# Patient Record
Sex: Female | Born: 1950
Health system: Southern US, Community
[De-identification: ages and names within clinical notes are randomized; demographics above are authoritative.]

## PROBLEM LIST (undated history)

## (undated) DIAGNOSIS — S02609A Fracture of mandible, unspecified, initial encounter for closed fracture: Secondary | ICD-10-CM

## (undated) DIAGNOSIS — I10 Essential (primary) hypertension: Secondary | ICD-10-CM

## (undated) HISTORY — DX: Fracture of mandible, unspecified, initial encounter for closed fracture: S02.609A

## (undated) HISTORY — PX: BREAST SURGERY: SHX581

## (undated) HISTORY — PX: LEG SURGERY: SHX1003

## (undated) HISTORY — PX: SPLENECTOMY: SUR1306

## (undated) HISTORY — DX: Essential (primary) hypertension: I10

---

## 2018-06-20 LAB — FECAL OCCULT BLOOD, IMMUNOCHEMICAL: IFOBT: NEGATIVE

## 2018-07-25 NOTE — Progress Notes (Signed)
Subjective:    Patient ID: Amanda [name], female    DOB: 11/22/1950, 67 y.o.   MRN: [id number]  HPI:  Amanda [name] is here to establish as a new pt.  She is a pleasant 67 year old female. PMH:HTN, HLD, GAD, Tobacco Use Disorder, tension HA She estimates to drink >80 oz water/day and follows a heart healthy diet She practices yoga and uses essential oil to manage acute anxiety She denies hx of depression She estimates to smoke 10 cigarettes/day and is open to tobacco cessation today- great! She recently moved from [place], [place]  GAD- r/t family stress: 2089 year mother has advanced dementia, she recently fell and is [facility] in [place] [name] disagree with her recommendations on how to care for their mother She lives with her half-brother and her sister-in-law Half-brother is unable to work due to lupus and fibromyalgia She has been off her anti-hypertensive for 1 month Patient Care Team    Relationship Specialty Notifications Start End  , Jinny BlossomKaty D, NP PCP - General Family Medicine  07/26/18     Patient Active Problem List   Diagnosis Date Noted  . Healthcare maintenance 07/26/2018  . Hypertension 07/26/2018  . GAD (generalized anxiety disorder) 07/26/2018  . Tobacco use 07/26/2018     Past Medical History:  Diagnosis Date  . Hypertension   . Jaw fracture [facility](HCC)      Past Surgical History:  Procedure Laterality Date  . BREAST SURGERY     "crystals"  . CESAREAN SECTION    . LEG SURGERY    . SPLENECTOMY       Family History  Problem Relation Age of Onset  . Heart attack Mother   . Hyperlipidemia Mother   . Hypertension Mother   . Heart disease Mother      Social History   Substance and Sexual Activity  Drug Use Never     Social History   Substance and Sexual Activity  Alcohol Use Yes  . Alcohol/week: 1.0 standard drinks  . Types: 1 Glasses of wine per week     Social History   Tobacco Use  Smoking Status Current  Every Day Smoker  . Packs/day: 0.25  . Years: 10.00  . Pack years: 2.50  . Types: Cigarettes  Smokeless Tobacco Never Used     Outpatient Encounter Medications as of 07/26/2018  Medication Sig  . carvedilol (COREG) 3.125 MG tablet Take 1 tablet (3.125 mg total) by mouth 2 (two) times daily with a meal.  . [DISCONTINUED] carvedilol (COREG) 3.125 MG tablet Take 3.125 mg by mouth 2 (two) times daily with a meal.  . nicotine (NICODERM CQ - DOSED IN MG/24 HOURS) 14 mg/24hr patch Place 1 patch (14 mg total) onto the skin daily.   No facility-administered encounter medications on file as of 07/26/2018.     Allergies: Penicillins  Body mass index is 26.42 kg/m.  Blood pressure 128/85, pulse 71, temperature 97.8 F (36.6 C), temperature source Oral, height 5\' 4"  (1.626 m), weight 153 lb 14.4 oz (69.8 kg), SpO2 97 %.  Review of Systems  Constitutional: Positive for fatigue. Negative for activity change, appetite change, chills, diaphoresis, fever and unexpected weight change.  HENT: Negative for congestion.   Eyes: Negative for visual disturbance.  Respiratory: Negative for cough, chest tightness, shortness of breath, wheezing and stridor.   Cardiovascular: Negative for chest pain, palpitations and leg swelling.  Gastrointestinal: Negative for abdominal distention, abdominal pain, blood in stool, constipation, diarrhea, nausea  and vomiting.  Endocrine: Negative for cold intolerance, heat intolerance, polydipsia, polyphagia and polyuria.  Genitourinary: Negative for difficulty urinating.  Musculoskeletal: Negative for arthralgias, back pain, gait problem, joint swelling, myalgias, neck pain and neck stiffness.  Neurological: Positive for headaches. Negative for dizziness.  Hematological: Does not bruise/bleed easily.  Psychiatric/Behavioral: Positive for sleep disturbance. Negative for agitation, behavioral problems, confusion, decreased concentration, dysphoric mood, hallucinations,  self-injury and suicidal ideas. The patient is nervous/anxious. The patient is not hyperactive.        Objective:   Physical Exam Vitals signs and nursing note reviewed.  Constitutional:      General: She is not in acute distress.    Appearance: She is normal weight. She is not ill-appearing or toxic-appearing.  HENT:     Head: Normocephalic and atraumatic.     Nose: Nose normal.  Eyes:     Conjunctiva/sclera: Conjunctivae normal.     Pupils: Pupils are equal, round, and reactive to light.  Cardiovascular:     Rate and Rhythm: Normal rate and regular rhythm.     Pulses: Normal pulses.     Heart sounds: No murmur. No gallop.   Pulmonary:     Effort: Pulmonary effort is normal. No respiratory distress.     Breath sounds: Normal breath sounds. No stridor. No wheezing, rhonchi or rales.  Chest:     Chest wall: No tenderness.  Skin:    Capillary Refill: Capillary refill takes less than 2 seconds.  Neurological:     Mental Status: She is alert and oriented to person, place, and time.  Psychiatric:        Mood and Affect: Mood normal.        Behavior: Behavior normal.        Thought Content: Thought content normal.        Judgment: Judgment normal.           Assessment & Plan:   1. Healthcare maintenance   2. GAD (generalized anxiety disorder)   3. Essential hypertension   4. Tobacco use     Healthcare maintenance Please continue all medications as directed. Continue to drink plenty of water and follow heart healthy diet. Reduce to stop tobacco use- you can do it! Continue yoga practice. Referral to Behavioral Health, re: Generalized Anxiety Disorder. Please schedule complete physical in 3 months, fasting lab appt the week prior.  Hypertension BP 128/85, HR 71 Carvedilol 3.125mg  BID Denies current cardiac sx's  GAD (generalized anxiety disorder) Continue to practice yoga, use essential oils Referral to Behavioral Health placed  Tobacco use Nicoderm patch  sent in    FOLLOW-UP:  Return in about 3 months (around 10/25/2018) for CPE, Fasting Labs.

## 2018-07-26 ENCOUNTER — Encounter: Payer: Self-pay | Admitting: Adult Health

## 2018-07-26 ENCOUNTER — Ambulatory Visit (INDEPENDENT_AMBULATORY_CARE_PROVIDER_SITE_OTHER): Payer: Medicare Other | Admitting: Adult Health

## 2018-07-26 VITALS — BP 128/85 | HR 71 | Temp 97.8°F | Ht 64.0 in | Wt 153.9 lb

## 2018-07-26 DIAGNOSIS — F411 Generalized anxiety disorder: Secondary | ICD-10-CM | POA: Diagnosis not present

## 2018-07-26 DIAGNOSIS — Z0000 Encounter for general adult medical examination without abnormal findings: Secondary | ICD-10-CM | POA: Diagnosis not present

## 2018-07-26 DIAGNOSIS — I10 Essential (primary) hypertension: Secondary | ICD-10-CM | POA: Diagnosis not present

## 2018-07-26 DIAGNOSIS — Z720 Tobacco use: Secondary | ICD-10-CM | POA: Diagnosis not present

## 2018-07-26 MED ORDER — NICOTINE 14 MG/24HR TD PT24: 14.0000 mg | MEDICATED_PATCH | Freq: Every day | TRANSDERMAL | 0 refills | Status: DC

## 2018-07-26 MED ORDER — CARVEDILOL 3.125 MG PO TABS: 3.1250 mg | ORAL_TABLET | Freq: Two times a day (BID) | ORAL | 3 refills | Status: DC

## 2018-07-26 NOTE — Assessment & Plan Note (Addendum)
BP 128/85, HR 71 Carvedilol 3.125mg  BID Denies current cardiac sx's

## 2018-07-26 NOTE — Assessment & Plan Note (Signed)
Nicoderm patch sent in

## 2018-07-26 NOTE — Patient Instructions (Signed)
Mediterranean Diet A Mediterranean diet refers to food and lifestyle choices that are based on the traditions of countries located on the [employer]. This way of eating has been shown to help prevent certain conditions and improve outcomes for people who have chronic diseases, like kidney disease and heart disease. What are tips for following this plan? Lifestyle  Cook and eat meals together with your family, when possible.  Drink enough fluid to keep your urine clear or pale yellow.  Be physically active every day. This includes: ? Aerobic exercise like running or swimming. ? Leisure activities like gardening, walking, or housework.  Get 7-8 hours of sleep each night.  If recommended by your health care provider, drink red wine in moderation. This means 1 glass a day for nonpregnant women and 2 glasses a day for men. A glass of wine equals 5 oz (150 mL). Reading food labels   Check the serving size of packaged foods. For foods such as rice and pasta, the serving size refers to the amount of cooked product, not dry.  Check the total fat in packaged foods. Avoid foods that have saturated fat or trans fats.  Check the ingredients list for added sugars, such as corn syrup. Shopping  At the grocery store, buy most of your food from the areas near the walls of the store. This includes: ? Fresh fruits and vegetables (produce). ? Grains, beans, nuts, and seeds. Some of these may be available in unpackaged forms or large amounts (in bulk). ? Fresh seafood. ? Poultry and eggs. ? Low-fat dairy products.  Buy whole ingredients instead of prepackaged foods.  Buy fresh fruits and vegetables in-season from local farmers markets.  Buy frozen fruits and vegetables in resealable bags.  If you do not have access to quality fresh seafood, buy precooked frozen shrimp or canned fish, such as tuna, salmon, or sardines.  Buy small amounts of raw or cooked vegetables, salads, or olives from  the deli or salad bar at your store.  Stock your pantry so you always have certain foods on hand, such as olive oil, canned tuna, canned tomatoes, rice, pasta, and beans. Cooking  Cook foods with extra-virgin olive oil instead of using butter or other vegetable oils.  Have meat as a side dish, and have vegetables or grains as your main dish. This means having meat in small portions or adding small amounts of meat to foods like pasta or stew.  Use beans or vegetables instead of meat in common dishes like chili or lasagna.  Experiment with different cooking methods. Try roasting or broiling vegetables instead of steaming or sauteing them.  Add frozen vegetables to soups, stews, pasta, or rice.  Add nuts or seeds for added healthy fat at each meal. You can add these to yogurt, salads, or vegetable dishes.  Marinate fish or vegetables using olive oil, lemon juice, garlic, and fresh herbs. Meal planning   Plan to eat 1 vegetarian meal one day each week. Try to work up to 2 vegetarian meals, if possible.  Eat seafood 2 or more times a week.  Have healthy snacks readily available, such as: ? Vegetable sticks with hummus. ? [place] yogurt. ? Fruit and nut trail mix.  Eat balanced meals throughout the week. This includes: ? Fruit: 2-3 servings a day ? Vegetables: 4-5 servings a day ? Low-fat dairy: 2 servings a day ? Fish, poultry, or lean meat: 1 serving a day ? Beans and legumes: 2 or more servings a week ?  Nuts and seeds: 1-2 servings a day ? Whole grains: 6-8 servings a day ? Extra-virgin olive oil: 3-4 servings a day  Limit red meat and sweets to only a few servings a month What are my food choices?  Mediterranean diet ? Recommended ? Grains: Whole-grain pasta. Brown rice. Bulgar wheat. Polenta. Couscous. Whole-wheat bread. [place] Cobbatmeal. Quinoa. ? Vegetables: Artichokes. Beets. Broccoli. Cabbage. Carrots. Eggplant. Green beans. Chard. Kale. Spinach. Onions. Leeks. Peas. Squash.  Tomatoes. Peppers. Radishes. ? Fruits: Apples. Apricots. Avocado. Berries. Bananas. Cherries. Dates. Figs. Grapes. Lemons. Melon. Oranges. Peaches. Plums. Pomegranate. ? Meats and other protein foods: Beans. Almonds. Sunflower seeds. Pine nuts. Peanuts. Cod. Salmon. Scallops. Shrimp. Tuna. Tilapia. Clams. Oysters. Eggs. ? Dairy: Low-fat milk. Cheese. Greek yogurt. ? Beverages: Water. Red wine. Herbal tea. ? Fats and oils: Extra virgin olive oil. Avocado oil. Grape seed oil. ? Sweets and desserts: [place] yogurt with honey. Baked apples. Poached pears. Trail mix. ? Seasoning and other foods: Basil. Cilantro. Coriander. Cumin. Mint. Parsley. Sage. Rosemary. Tarragon. Garlic. Oregano. Thyme. Pepper. Balsalmic vinegar. Tahini. Hummus. Tomato sauce. Olives. Mushrooms. ? Limit these ? Grains: Prepackaged pasta or rice dishes. Prepackaged cereal with added sugar. ? Vegetables: Deep fried potatoes (french fries). ? Fruits: Fruit canned in syrup. ? Meats and other protein foods: Beef. Pork. Lamb. Poultry with skin. Hot dogs. Tomasa BlaseBacon. ? Dairy: Ice cream. Sour cream. Whole milk. ? Beverages: Juice. Sugar-sweetened soft drinks. Beer. Liquor and spirits. ? Fats and oils: Butter. Canola oil. Vegetable oil. Beef fat (tallow). Lard. ? Sweets and desserts: Cookies. Cakes. Pies. Candy. ? Seasoning and other foods: Mayonnaise. Premade sauces and marinades. ? The items listed may not be a complete list. Talk with your dietitian about what dietary choices are right for you. Summary  The Mediterranean diet includes both food and lifestyle choices.  Eat a variety of fresh fruits and vegetables, beans, nuts, seeds, and whole grains.  Limit the amount of red meat and sweets that you eat.  Talk with your health care provider about whether it is safe for you to drink red wine in moderation. This means 1 glass a day for nonpregnant women and 2 glasses a day for men. A glass of wine equals 5 oz (150 mL). This information  is not intended to replace advice given to you by your health care provider. Make sure you discuss any questions you have with your health care provider. Document Released: 03/17/2016 Document Revised: 04/19/2016 Document Reviewed: 03/17/2016 Elsevier Interactive Patient Education  2019 Elsevier Inc.  Generalized Anxiety Disorder, Adult Generalized anxiety disorder (GAD) is a mental health disorder. People with this condition constantly worry about everyday events. Unlike normal anxiety, worry related to GAD is not triggered by a specific event. These worries also do not fade or get better with time. GAD interferes with life functions, including relationships, work, and school. GAD can vary from mild to severe. People with severe GAD can have intense waves of anxiety with physical symptoms (panic attacks). What are the causes? The exact cause of GAD is not known. What increases the risk? This condition is more likely to develop in:  Women.  People who have a family history of anxiety disorders.  People who are very shy.  People who experience very stressful life events, such as the death of a loved one.  People who have a very stressful family environment. What are the signs or symptoms? People with GAD often worry excessively about many things in their lives, such as their health and  family. They may also be overly concerned about:  Doing well at work.  Being on time.  Natural disasters.  Friendships. Physical symptoms of GAD include:  Fatigue.  Muscle tension or having muscle twitches.  Trembling or feeling shaky.  Being easily startled.  Feeling like your heart is pounding or racing.  Feeling out of breath or like you cannot take a deep breath.  Having trouble falling asleep or staying asleep.  Sweating.  Nausea, diarrhea, or irritable bowel syndrome (IBS).  Headaches.  Trouble concentrating or remembering facts.  Restlessness.  Irritability. How is this  diagnosed? Your health care provider can diagnose GAD based on your symptoms and medical history. You will also have a physical exam. The health care provider will ask specific questions about your symptoms, including how severe they are, when they started, and if they come and go. Your health care provider may ask you about your use of alcohol or drugs, including prescription medicines. Your health care provider may refer you to a mental health specialist for further evaluation. Your health care provider will do a thorough examination and may perform additional tests to rule out other possible causes of your symptoms. To be diagnosed with GAD, a person must have anxiety that:  Is out of his or her control.  Affects several different aspects of his or her life, such as work and relationships.  Causes distress that makes him or her unable to take part in normal activities.  Includes at least three physical symptoms of GAD, such as restlessness, fatigue, trouble concentrating, irritability, muscle tension, or sleep problems. Before your health care provider can confirm a diagnosis of GAD, these symptoms must be present more days than they are not, and they must last for six months or longer. How is this treated? The following therapies are usually used to treat GAD:  Medicine. Antidepressant medicine is usually prescribed for long-term daily control. Antianxiety medicines may be added in severe cases, especially when panic attacks occur.  Talk therapy (psychotherapy). Certain types of talk therapy can be helpful in treating GAD by providing support, education, and guidance. Options include: ? Cognitive behavioral therapy (CBT). People learn coping skills and techniques to ease their anxiety. They learn to identify unrealistic or negative thoughts and behaviors and to replace them with positive ones. ? Acceptance and commitment therapy (ACT). This treatment teaches people how to be mindful as a way  to cope with unwanted thoughts and feelings. ? Biofeedback. This process trains you to manage your body's response (physiological response) through breathing techniques and relaxation methods. You will work with a therapist while machines are used to monitor your physical symptoms.  Stress management techniques. These include yoga, meditation, and exercise. A mental health specialist can help determine which treatment is best for you. Some people see improvement with one type of therapy. However, other people require a combination of therapies. Follow these instructions at home:  Take over-the-counter and prescription medicines only as told by your health care provider.  Try to maintain a normal routine.  Try to anticipate stressful situations and allow extra time to manage them.  Practice any stress management or self-calming techniques as taught by your health care provider.  Do not punish yourself for setbacks or for not making progress.  Try to recognize your accomplishments, even if they are small.  Keep all follow-up visits as told by your health care provider. This is important. Contact a health care provider if:  Your symptoms do not get better.  Your symptoms get worse.  You have signs of depression, such as: ? A persistently sad, cranky, or irritable mood. ? Loss of enjoyment in activities that used to bring you joy. ? Change in weight or eating. ? Changes in sleeping habits. ? Avoiding friends or family members. ? Loss of energy for normal tasks. ? Feelings of guilt or worthlessness. Get help right away if:  You have serious thoughts about hurting yourself or others. If you ever feel like you may hurt yourself or others, or have thoughts about taking your own life, get help right away. You can go to your nearest emergency department or call:  Your local emergency services (911 in the [place]  A suicide crisis helpline, such as the National Suicide Prevention  Lifeline at 684-657-89961-(520)695-0972. This is open 24 hours a day. Summary  Generalized anxiety disorder (GAD) is a mental health disorder that involves worry that is not triggered by a specific event.  People with GAD often worry excessively about many things in their lives, such as their health and family.  GAD may cause physical symptoms such as restlessness, trouble concentrating, sleep problems, frequent sweating, nausea, diarrhea, headaches, and trembling or muscle twitching.  A mental health specialist can help determine which treatment is best for you. Some people see improvement with one type of therapy. However, other people require a combination of therapies. This information is not intended to replace advice given to you by your health care provider. Make sure you discuss any questions you have with your health care provider. Document Released: 11/19/2012 Document Revised: 06/14/2016 Document Reviewed: 06/14/2016 Elsevier Interactive Patient Education  [employer]2019 Elsevier Inc.  Please continue all medications as directed. Continue to drink plenty of water and follow heart healthy diet. Reduce to stop tobacco use- you can do it! Continue yoga practice. Referral to Behavioral Health, re: Generalized Anxiety Disorder. Please schedule complete physical in 3 months, fasting lab appt the week prior. GOOD LUCK WITH THE PUPPY LITTER! WELCOME TO THE PRACTICE!

## 2018-07-26 NOTE — Assessment & Plan Note (Signed)
Continue to practice yoga, use essential oils Referral to Behavioral Health placed

## 2018-07-26 NOTE — Assessment & Plan Note (Signed)
Please continue all medications as directed. Continue to drink plenty of water and follow heart healthy diet. Reduce to stop tobacco use- you can do it! Continue yoga practice. Referral to Behavioral Health, re: Generalized Anxiety Disorder. Please schedule complete physical in 3 months, fasting lab appt the week prior.

## 2018-09-17 ENCOUNTER — Ambulatory Visit (INDEPENDENT_AMBULATORY_CARE_PROVIDER_SITE_OTHER): Payer: Medicare Other | Admitting: Psychology

## 2018-09-17 DIAGNOSIS — F411 Generalized anxiety disorder: Secondary | ICD-10-CM | POA: Diagnosis not present

## 2018-10-23 ENCOUNTER — Ambulatory Visit (INDEPENDENT_AMBULATORY_CARE_PROVIDER_SITE_OTHER): Payer: Medicare Other | Admitting: Psychology

## 2018-10-23 DIAGNOSIS — F411 Generalized anxiety disorder: Secondary | ICD-10-CM

## 2018-10-25 ENCOUNTER — Other Ambulatory Visit (INDEPENDENT_AMBULATORY_CARE_PROVIDER_SITE_OTHER): Payer: Medicare Other

## 2018-10-25 ENCOUNTER — Other Ambulatory Visit: Payer: Self-pay

## 2018-10-25 DIAGNOSIS — Z0000 Encounter for general adult medical examination without abnormal findings: Secondary | ICD-10-CM | POA: Diagnosis not present

## 2018-10-25 DIAGNOSIS — R7303 Prediabetes: Secondary | ICD-10-CM | POA: Diagnosis not present

## 2018-10-25 DIAGNOSIS — E785 Hyperlipidemia, unspecified: Secondary | ICD-10-CM | POA: Diagnosis not present

## 2018-10-25 DIAGNOSIS — Z79899 Other long term (current) drug therapy: Secondary | ICD-10-CM | POA: Diagnosis not present

## 2018-10-25 DIAGNOSIS — I10 Essential (primary) hypertension: Secondary | ICD-10-CM | POA: Diagnosis not present

## 2018-10-25 DIAGNOSIS — R5383 Other fatigue: Secondary | ICD-10-CM | POA: Diagnosis not present

## 2018-10-26 LAB — CBC WITH DIFFERENTIAL/PLATELET
Basophils Absolute: 0.1 10*3/uL (ref 0.0–0.2)
Basos: 1 %
EOS (ABSOLUTE): 0.2 10*3/uL (ref 0.0–0.4)
Eos: 3 %
Hematocrit: 40.1 % (ref 34.0–46.6)
Hemoglobin: 13.6 g/dL (ref 11.1–15.9)
Immature Grans (Abs): 0 10*3/uL (ref 0.0–0.1)
Immature Granulocytes: 0 %
Lymphocytes Absolute: 2.4 10*3/uL (ref 0.7–3.1)
Lymphs: 31 %
MCH: 32.3 pg (ref 26.6–33.0)
MCHC: 33.9 g/dL (ref 31.5–35.7)
MCV: 95 fL (ref 79–97)
Monocytes Absolute: 0.9 10*3/uL (ref 0.1–0.9)
Monocytes: 12 %
Neutrophils Absolute: 4.1 10*3/uL (ref 1.4–7.0)
Neutrophils: 53 %
Platelets: 446 10*3/uL (ref 150–450)
RBC: 4.21 x10E6/uL (ref 3.77–5.28)
RDW: 11.8 % (ref 11.7–15.4)
WBC: 7.7 10*3/uL (ref 3.4–10.8)

## 2018-10-26 LAB — COMPREHENSIVE METABOLIC PANEL
ALT: 18 IU/L (ref 0–32)
AST: 16 IU/L (ref 0–40)
Albumin/Globulin Ratio: 1.8 (ref 1.2–2.2)
Albumin: 4 g/dL (ref 3.8–4.8)
Alkaline Phosphatase: 79 IU/L (ref 39–117)
BUN/Creatinine Ratio: 18 (ref 12–28)
BUN: 12 mg/dL (ref 8–27)
Bilirubin Total: 0.3 mg/dL (ref 0.0–1.2)
CO2: 23 mmol/L (ref 20–29)
Calcium: 9.7 mg/dL (ref 8.7–10.3)
Chloride: 105 mmol/L (ref 96–106)
Creatinine, Ser: 0.65 mg/dL (ref 0.57–1.00)
GFR calc Af Amer: 105 mL/min/{1.73_m2} (ref >59)
GFR calc non Af Amer: 91 mL/min/{1.73_m2} (ref >59)
Globulin, Total: 2.2 g/dL (ref 1.5–4.5)
Glucose: 85 mg/dL (ref 65–99)
Potassium: 4.3 mmol/L (ref 3.5–5.2)
Sodium: 142 mmol/L (ref 134–144)
Total Protein: 6.2 g/dL (ref 6.0–8.5)

## 2018-10-26 LAB — LIPID PANEL
Chol/HDL Ratio: 3.9 ratio (ref 0.0–4.4)
Cholesterol, Total: 219 mg/dL — ABNORMAL HIGH (ref 100–199)
HDL: 56 mg/dL (ref >39)
LDL Calculated: 138 mg/dL — ABNORMAL HIGH (ref 0–99)
Triglycerides: 126 mg/dL (ref 0–149)
VLDL Cholesterol Cal: 25 mg/dL (ref 5–40)

## 2018-10-26 LAB — HEMOGLOBIN A1C
Est. average glucose Bld gHb Est-mCnc: 117 mg/dL
Hgb A1c MFr Bld: 5.7 % — ABNORMAL HIGH (ref 4.8–5.6)

## 2018-10-26 LAB — TSH: TSH: 1.81 u[IU]/mL (ref 0.450–4.500)

## 2018-10-29 NOTE — Progress Notes (Deleted)
Subjective:    Patient ID: Amanda [name], female    DOB: 01-05-1951, 68 y.o.   MRN: [id number]  HPI: 07/26/18 OV:  Amanda [name] is here to establish as a new pt.  She is a pleasant 68 year old female. PMH:HTN, HLD, GAD, Tobacco Use Disorder, tension HA She estimates to drink >80 oz water/day and follows a heart healthy diet She practices yoga and uses essential oil to manage acute anxiety She denies hx of depression She estimates to smoke 10 cigarettes/day and is open to tobacco cessation today- great! She recently moved from [place], [place]  GAD- r/t family stress: 73 year mother has advanced dementia, she recently fell and is [facility] in [place] [name] disagree with her recommendations on how to care for their mother She lives with her half-brother and her sister-in-law Half-brother is unable to work due to lupus and fibromyalgia She has been off her anti-hypertensive for 1 month  11/01/2018 OV: Amanda [name] is here for CPE  Reviewed Recent Labs: TSH-WNL, 1.810 The 10-year ASCVD risk score Denman George DC Montez Hageman., et al., 2013) is: 18.9% Values used to calculate the score:  Age: 32 years  Sex: Female  Is Non-Hispanic African American: No  Diabetic: No  Tobacco smoker: Yes  Systolic Blood Pressure: 128 mmHg  Is BP treated: Yes  HDL Cholesterol: 56 mg/dL  Total Cholesterol: 067 mg/dL [id number] Rec starting statin therapy A1c-5.7, pre-diabetic  CMP-WNL CBC-WNL  Healthcare Maintenance: PAP- Mammogram- Immunizations-  Patient Care Team    Relationship Specialty Notifications Start End  Julaine Fusi, NP PCP - General Family Medicine  07/26/18     Patient Active Problem List   Diagnosis Date Noted  . Healthcare maintenance 07/26/2018  . Hypertension 07/26/2018  . GAD (generalized anxiety disorder) 07/26/2018  . Tobacco use 07/26/2018     Past Medical History:  Diagnosis Date  . Hypertension   . Jaw fracture [facility])       Past Surgical History:  Procedure Laterality Date  . BREAST SURGERY     "crystals"  . CESAREAN SECTION    . LEG SURGERY    . SPLENECTOMY       Family History  Problem Relation Age of Onset  . Heart attack Mother   . Hyperlipidemia Mother   . Hypertension Mother   . Heart disease Mother      Social History   Substance and Sexual Activity  Drug Use Never     Social History   Substance and Sexual Activity  Alcohol Use Yes  . Alcohol/week: 1.0 standard drinks  . Types: 1 Glasses of wine per week     Social History   Tobacco Use  Smoking Status Current Every Day Smoker  . Packs/day: 0.25  . Years: 10.00  . Pack years: 2.50  . Types: Cigarettes  Smokeless Tobacco Never Used     Outpatient Encounter Medications as of 11/01/2018  Medication Sig  . carvedilol (COREG) 3.125 MG tablet Take 1 tablet (3.125 mg total) by mouth 2 (two) times daily with a meal.  . nicotine (NICODERM CQ - DOSED IN MG/24 HOURS) 14 mg/24hr patch Place 1 patch (14 mg total) onto the skin daily.   No facility-administered encounter medications on file as of 11/01/2018.     Allergies: Penicillins  There is no height or weight on file to calculate BMI.  There were no vitals taken for this visit.  Review of Systems  Constitutional: Positive for fatigue. Negative for activity change,  appetite change, chills, diaphoresis, fever and unexpected weight change.  HENT: Negative for congestion.   Eyes: Negative for visual disturbance.  Respiratory: Negative for cough, chest tightness, shortness of breath, wheezing and stridor.   Cardiovascular: Negative for chest pain, palpitations and leg swelling.  Gastrointestinal: Negative for abdominal distention, abdominal pain, blood in stool, constipation, diarrhea, nausea and vomiting.  Endocrine: Negative for cold intolerance, heat intolerance, polydipsia, polyphagia and polyuria.  Genitourinary: Negative for difficulty urinating.   Musculoskeletal: Negative for arthralgias, back pain, gait problem, joint swelling, myalgias, neck pain and neck stiffness.  Neurological: Positive for headaches. Negative for dizziness.  Hematological: Does not bruise/bleed easily.  Psychiatric/Behavioral: Positive for sleep disturbance. Negative for agitation, behavioral problems, confusion, decreased concentration, dysphoric mood, hallucinations, self-injury and suicidal ideas. The patient is nervous/anxious. The patient is not hyperactive.        Objective:   Physical Exam Vitals signs and nursing note reviewed.  Constitutional:      General: She is not in acute distress.    Appearance: She is normal weight. She is not ill-appearing or toxic-appearing.  HENT:     Head: Normocephalic and atraumatic.     Nose: Nose normal.  Eyes:     Conjunctiva/sclera: Conjunctivae normal.     Pupils: Pupils are equal, round, and reactive to light.  Cardiovascular:     Rate and Rhythm: Normal rate and regular rhythm.     Pulses: Normal pulses.     Heart sounds: No murmur. No gallop.   Pulmonary:     Effort: Pulmonary effort is normal. No respiratory distress.     Breath sounds: Normal breath sounds. No stridor. No wheezing, rhonchi or rales.  Chest:     Chest wall: No tenderness.  Skin:    Capillary Refill: Capillary refill takes less than 2 seconds.  Neurological:     Mental Status: She is alert and oriented to person, place, and time.  Psychiatric:        Mood and Affect: Mood normal.        Behavior: Behavior normal.        Thought Content: Thought content normal.        Judgment: Judgment normal.           Assessment & Plan:   No diagnosis found.  No problem-specific Assessment & Plan notes found for this encounter.    FOLLOW-UP:  No follow-ups on file.

## 2018-10-31 ENCOUNTER — Encounter: Payer: Self-pay | Admitting: Adult Health

## 2018-11-01 ENCOUNTER — Encounter: Payer: Self-pay | Admitting: Adult Health

## 2018-11-01 ENCOUNTER — Other Ambulatory Visit: Payer: Self-pay | Admitting: Adult Health

## 2018-11-01 DIAGNOSIS — E7800 Pure hypercholesterolemia, unspecified: Secondary | ICD-10-CM

## 2018-11-01 DIAGNOSIS — Z79899 Other long term (current) drug therapy: Secondary | ICD-10-CM

## 2018-11-01 MED ORDER — ATORVASTATIN CALCIUM 10 MG PO TABS: 10.0000 mg | ORAL_TABLET | Freq: Every day | ORAL | 1 refills | Status: DC

## 2018-11-06 ENCOUNTER — Ambulatory Visit (INDEPENDENT_AMBULATORY_CARE_PROVIDER_SITE_OTHER): Payer: Medicare Other | Admitting: Psychology

## 2018-11-06 DIAGNOSIS — F411 Generalized anxiety disorder: Secondary | ICD-10-CM | POA: Diagnosis not present

## 2018-11-21 ENCOUNTER — Ambulatory Visit (INDEPENDENT_AMBULATORY_CARE_PROVIDER_SITE_OTHER): Payer: Medicare Other | Admitting: Psychology

## 2018-11-21 DIAGNOSIS — F411 Generalized anxiety disorder: Secondary | ICD-10-CM

## 2018-12-03 ENCOUNTER — Ambulatory Visit (INDEPENDENT_AMBULATORY_CARE_PROVIDER_SITE_OTHER): Payer: Medicare Other | Admitting: Psychology

## 2018-12-03 DIAGNOSIS — F411 Generalized anxiety disorder: Secondary | ICD-10-CM | POA: Diagnosis not present

## 2018-12-17 ENCOUNTER — Ambulatory Visit (INDEPENDENT_AMBULATORY_CARE_PROVIDER_SITE_OTHER): Payer: Medicare Other | Admitting: Psychology

## 2018-12-17 DIAGNOSIS — F411 Generalized anxiety disorder: Secondary | ICD-10-CM | POA: Diagnosis not present

## 2019-01-02 ENCOUNTER — Ambulatory Visit (INDEPENDENT_AMBULATORY_CARE_PROVIDER_SITE_OTHER): Payer: Medicare Other | Admitting: Psychology

## 2019-01-02 DIAGNOSIS — F411 Generalized anxiety disorder: Secondary | ICD-10-CM

## 2019-01-15 ENCOUNTER — Ambulatory Visit (INDEPENDENT_AMBULATORY_CARE_PROVIDER_SITE_OTHER): Payer: Medicare Other | Admitting: Psychology

## 2019-01-15 DIAGNOSIS — F411 Generalized anxiety disorder: Secondary | ICD-10-CM | POA: Diagnosis not present

## 2019-01-28 ENCOUNTER — Ambulatory Visit: Payer: Medicare Other | Admitting: Psychology

## 2019-01-31 ENCOUNTER — Ambulatory Visit (INDEPENDENT_AMBULATORY_CARE_PROVIDER_SITE_OTHER): Payer: Medicare Other | Admitting: Psychology

## 2019-01-31 DIAGNOSIS — F411 Generalized anxiety disorder: Secondary | ICD-10-CM

## 2019-02-11 ENCOUNTER — Ambulatory Visit (INDEPENDENT_AMBULATORY_CARE_PROVIDER_SITE_OTHER): Payer: Medicare Other | Admitting: Psychology

## 2019-02-11 DIAGNOSIS — F411 Generalized anxiety disorder: Secondary | ICD-10-CM

## 2019-02-25 ENCOUNTER — Ambulatory Visit (INDEPENDENT_AMBULATORY_CARE_PROVIDER_SITE_OTHER): Payer: Medicare Other | Admitting: Psychology

## 2019-02-25 DIAGNOSIS — F411 Generalized anxiety disorder: Secondary | ICD-10-CM

## 2019-03-25 ENCOUNTER — Ambulatory Visit (INDEPENDENT_AMBULATORY_CARE_PROVIDER_SITE_OTHER): Payer: Medicare Other | Admitting: Psychology

## 2019-03-25 DIAGNOSIS — F331 Major depressive disorder, recurrent, moderate: Secondary | ICD-10-CM

## 2019-04-08 ENCOUNTER — Ambulatory Visit (INDEPENDENT_AMBULATORY_CARE_PROVIDER_SITE_OTHER): Payer: Medicare Other | Admitting: Psychology

## 2019-04-08 DIAGNOSIS — F411 Generalized anxiety disorder: Secondary | ICD-10-CM

## 2019-04-22 ENCOUNTER — Ambulatory Visit (INDEPENDENT_AMBULATORY_CARE_PROVIDER_SITE_OTHER): Payer: Medicare Other | Admitting: Psychology

## 2019-04-22 DIAGNOSIS — F411 Generalized anxiety disorder: Secondary | ICD-10-CM

## 2019-05-06 ENCOUNTER — Ambulatory Visit (INDEPENDENT_AMBULATORY_CARE_PROVIDER_SITE_OTHER): Payer: Medicare Other | Admitting: Psychology

## 2019-05-06 DIAGNOSIS — F411 Generalized anxiety disorder: Secondary | ICD-10-CM

## 2019-05-20 ENCOUNTER — Ambulatory Visit (INDEPENDENT_AMBULATORY_CARE_PROVIDER_SITE_OTHER): Payer: Medicare Other | Admitting: Psychology

## 2019-05-20 DIAGNOSIS — F411 Generalized anxiety disorder: Secondary | ICD-10-CM

## 2019-06-03 ENCOUNTER — Ambulatory Visit (INDEPENDENT_AMBULATORY_CARE_PROVIDER_SITE_OTHER): Payer: Medicare Other | Admitting: Psychology

## 2019-06-03 DIAGNOSIS — F411 Generalized anxiety disorder: Secondary | ICD-10-CM | POA: Diagnosis not present

## 2019-06-17 ENCOUNTER — Ambulatory Visit (INDEPENDENT_AMBULATORY_CARE_PROVIDER_SITE_OTHER): Payer: Medicare Other | Admitting: Psychology

## 2019-06-17 DIAGNOSIS — F411 Generalized anxiety disorder: Secondary | ICD-10-CM

## 2019-07-01 ENCOUNTER — Ambulatory Visit: Payer: Medicare Other | Admitting: Psychology

## 2019-07-15 ENCOUNTER — Ambulatory Visit (INDEPENDENT_AMBULATORY_CARE_PROVIDER_SITE_OTHER): Payer: Medicare Other | Admitting: Psychology

## 2019-07-15 DIAGNOSIS — F321 Major depressive disorder, single episode, moderate: Secondary | ICD-10-CM | POA: Diagnosis not present

## 2019-07-29 ENCOUNTER — Ambulatory Visit: Payer: Medicare Other | Admitting: Psychology

## 2019-08-12 ENCOUNTER — Ambulatory Visit (INDEPENDENT_AMBULATORY_CARE_PROVIDER_SITE_OTHER): Payer: Medicare Other | Admitting: Psychology

## 2019-08-12 DIAGNOSIS — F411 Generalized anxiety disorder: Secondary | ICD-10-CM

## 2019-08-26 ENCOUNTER — Ambulatory Visit (INDEPENDENT_AMBULATORY_CARE_PROVIDER_SITE_OTHER): Payer: Medicare Other | Admitting: Psychology

## 2019-08-26 DIAGNOSIS — F411 Generalized anxiety disorder: Secondary | ICD-10-CM | POA: Diagnosis not present

## 2019-08-29 ENCOUNTER — Other Ambulatory Visit: Payer: Self-pay | Admitting: Adult Health

## 2019-08-30 ENCOUNTER — Telehealth: Payer: Self-pay

## 2019-08-30 NOTE — Telephone Encounter (Signed)
Please call pt to schedule f/u in office.  No further refills until pt is seen.  Tiajuana Amass, CMA

## 2019-09-09 ENCOUNTER — Ambulatory Visit (INDEPENDENT_AMBULATORY_CARE_PROVIDER_SITE_OTHER): Payer: Medicare Other | Admitting: Psychology

## 2019-09-09 DIAGNOSIS — F411 Generalized anxiety disorder: Secondary | ICD-10-CM

## 2019-09-12 ENCOUNTER — Telehealth: Payer: Self-pay | Admitting: Adult Health

## 2019-09-12 NOTE — Telephone Encounter (Signed)
Pt called states she had a home visit with [employer] (her ins co) nurse they sent to house for check up due to COVID isolation.  --Per patient she will bring all paper work/ lab results to appt on 2/10 for provider review-- she was advised by them to seek appt w/ PCP to address health concerns  --P requested IN OFFICE Appt hasn't seen provider in a year.  --Forwarding request to med asst .  --glh

## 2019-09-17 NOTE — Progress Notes (Signed)
Subjective:    Patient ID: Amanda [name], female    DOB: November 01, 1950, 69 y.o.   MRN: [id number]  HPI:  07/26/2018 OV: Amanda [name] is here to establish as a new pt.  She is a pleasant 69 year old female. PMH:HTN, HLD, GAD, Tobacco Use Disorder, tension HA She estimates to drink >80 oz water/day and follows a heart healthy diet She practices yoga and uses essential oil to manage acute anxiety She denies hx of depression She estimates to smoke 10 cigarettes/day and is open to tobacco cessation today- great! She recently moved from [place]  GAD- r/t family stress: 48 year mother has advanced dementia, she recently fell and is [place] in [place] [name] disagree with her recommendations on how to care for their mother She lives with her half-brother and her sister-in-law Half-brother is unable to work due to lupus and fibromyalgia She has been off her anti-hypertensive for 1 month  09/18/2019 OV: Amanda [name] is here to discuss "a few issues"- She would like Cologuard ordered- declines Colonoscopy due to her sister experiencing complications with procedure. She needs mammogram ordered. She requests referral to Optometry- placed. She stopped Atorvastatin 71m Dec 2020 due to GI upset. She had cancelled previously scheduled CPE with fasting labs due to pandemic exposure concerns. She continues to follow heart healthy diet, remains quite active. She denies acute complaints today.  Patient Care Team    Relationship Specialty Notifications Start End  DEsaw Grandchild NP PCP - General Family Medicine  07/26/18     Patient Active Problem List   Diagnosis Date Noted  . Healthcare maintenance 07/26/2018  . Hypertension 07/26/2018  . GAD (generalized anxiety disorder) 07/26/2018  . Tobacco use 07/26/2018     Past Medical History:  Diagnosis Date  . Hypertension   . Jaw fracture ([facility]      Past Surgical History:  Procedure Laterality Date  . BREAST  SURGERY     "crystals"  . CESAREAN SECTION    . LEG SURGERY    . SPLENECTOMY       Family History  Problem Relation Age of Onset  . Heart attack Mother   . Hyperlipidemia Mother   . Hypertension Mother   . Heart disease Mother      Social History   Substance and Sexual Activity  Drug Use Never     Social History   Substance and Sexual Activity  Alcohol Use Yes  . Alcohol/week: 1.0 standard drinks  . Types: 1 Glasses of wine per week     Social History   Tobacco Use  Smoking Status Current Every Day Smoker  . Packs/day: 0.25  . Years: 10.00  . Pack years: 2.50  . Types: Cigarettes  Smokeless Tobacco Never Used     Outpatient Encounter Medications as of 09/18/2019  Medication Sig  . carvedilol (COREG) 3.125 MG tablet TAKE 1 TABLET BY MOUTH TWICE DAILY WITH A MEAL  . nicotine (NICODERM CQ - DOSED IN MG/24 HOURS) 14 mg/24hr patch Place 1 patch (14 mg total) onto the skin daily.  . [DISCONTINUED] atorvastatin (LIPITOR) 10 MG tablet Take 1 tablet (10 mg total) by mouth daily.   No facility-administered encounter medications on file as of 09/18/2019.    Allergies: Atorvastatin and Penicillins  Body mass index is 27.4 kg/m.  Blood pressure 131/64, pulse 66, temperature 97.9 F (36.6 C), temperature source Oral, height '5\' 4"'  (1.626 m), weight 159 lb 9.6 oz (72.4 kg), SpO2 99 %.  Review of Systems  Constitutional: Positive for fatigue. Negative for activity change, appetite change, chills, diaphoresis, fever and unexpected weight change.  HENT: Negative for congestion.   Respiratory: Negative for cough, chest tightness, shortness of breath, wheezing and stridor.   Cardiovascular: Negative for chest pain, palpitations and leg swelling.  Gastrointestinal: Negative for abdominal distention, abdominal pain, blood in stool, constipation, diarrhea, nausea and vomiting.  Endocrine: Negative for polydipsia, polyphagia and polyuria.  Genitourinary: Negative for  difficulty urinating and flank pain.  Musculoskeletal: Negative for arthralgias, back pain, gait problem, joint swelling, myalgias, neck pain and neck stiffness.  Neurological: Negative for dizziness and headaches.  Hematological: Negative for adenopathy. Does not bruise/bleed easily.  Psychiatric/Behavioral: Negative for agitation, behavioral problems, confusion, decreased concentration, dysphoric mood, hallucinations, self-injury, sleep disturbance and suicidal ideas. The patient is not nervous/anxious and is not hyperactive.        Objective:   Physical Exam Vitals and nursing note reviewed.  Constitutional:      General: She is not in acute distress.    Appearance: Normal appearance. She is normal weight. She is not ill-appearing, toxic-appearing or diaphoretic.  HENT:     Head: Normocephalic and atraumatic.  Eyes:     Extraocular Movements: Extraocular movements intact.     Conjunctiva/sclera: Conjunctivae normal.     Pupils: Pupils are equal, round, and reactive to light.  Cardiovascular:     Rate and Rhythm: Normal rate and regular rhythm.     Pulses: Normal pulses.     Heart sounds: Normal heart sounds. No murmur. No friction rub. No gallop.   Pulmonary:     Effort: Pulmonary effort is normal. No respiratory distress.     Breath sounds: Normal breath sounds. No stridor. No wheezing, rhonchi or rales.  Chest:     Chest wall: No tenderness.  Skin:    General: Skin is warm and dry.     Capillary Refill: Capillary refill takes less than 2 seconds.  Neurological:     Mental Status: She is alert and oriented to person, place, and time.  Psychiatric:        Mood and Affect: Mood normal.        Behavior: Behavior normal.        Thought Content: Thought content normal.        Judgment: Judgment normal.        Assessment & Plan:   1. Encounter for screening mammogram for malignant neoplasm of breast   2. Screening for colon cancer   3. Encounter for vision screening   4.  Healthcare maintenance     Healthcare maintenance Cologuard ordered. Mammogram ordered. Optometry referral placed. Continue all medications as directed. Remain well hydrated, follow Continue regular exercise.  Recommend at least 30 minutes daily, 5 days per week of walking,, biking, swimming, YouTube/Pinterest workout videos. Basic information for mRNA COVID-19 vaccines Please note that these CDC guidelines can change from week to week, and even day to day.  Refer to the [facility] website for the most current recommendations for any given day at GeekRegister.com.ee   1) Under the EUAs (emergency use authorization), the following age groups are authorized to receive the COVID-19 vaccination: . Pfizer-BioNTech: ages ?16 years . Moderna: ages ?18 years . Children and adolescents outside of these authorized age groups should not receive COVID-19 vaccination at this time   2) You are not eligible to receive a COVID-19 vaccine if: . You have received another vaccine in the last 14-days (example: flu shot,  shingles, tetanus, etc.) . You currently have a positive test for COVID-19. The vaccine must be deferred until you have recovered from the acute illness and the criteria has been met for you to discontinue isolation. . You have received passive antibody therapy (monoclonal antibodies or convalescent serum) as treatment for COVID-19 within the past 90 days   3) Currently COVID-19 vaccines are contraindicated in patients who have had an anaphylactic reaction to polyethylene glycol ( ie. GoLYTELY bowel prep or MiraLAX ) or polysorbate products (often used as a thickening agent for many ice creams, frozen custard and whipped dessert products etc).   Also, if you had an anaphylactic reaction to your first COVID-19 vaccination, you cannot receive your second.     - Also if you are currently pregnant please check with your obstetrician to see what their recommendations are regarding  vaccination.   - Also, if you have a chronic condition such as CLL, RA or other immunocompromising conditions, please know that no data are currently available (or very limited data) on the safety and efficacy of mRNA COVID-19 vaccines in persons with autoimmune/ immunocompromising conditions.   Please seek the advice of your specialist regarding whether or not you should receive your COVID-19 vaccination.   Please call [phone] to schedule COVID-19 vaccine.  Follow-up in  3 months for physical with fasting labs.    FOLLOW-UP:  Return for CPE, Fasting Labs.

## 2019-09-18 ENCOUNTER — Encounter: Payer: Self-pay | Admitting: Adult Health

## 2019-09-18 ENCOUNTER — Ambulatory Visit (INDEPENDENT_AMBULATORY_CARE_PROVIDER_SITE_OTHER): Payer: Medicare Other | Admitting: Adult Health

## 2019-09-18 ENCOUNTER — Other Ambulatory Visit: Payer: Self-pay

## 2019-09-18 VITALS — BP 131/64 | HR 66 | Temp 97.9°F | Ht 64.0 in | Wt 159.6 lb

## 2019-09-18 DIAGNOSIS — Z0100 Encounter for examination of eyes and vision without abnormal findings: Secondary | ICD-10-CM | POA: Diagnosis not present

## 2019-09-18 DIAGNOSIS — Z1231 Encounter for screening mammogram for malignant neoplasm of breast: Secondary | ICD-10-CM | POA: Diagnosis not present

## 2019-09-18 DIAGNOSIS — Z1211 Encounter for screening for malignant neoplasm of colon: Secondary | ICD-10-CM | POA: Diagnosis not present

## 2019-09-18 DIAGNOSIS — Z0000 Encounter for general adult medical examination without abnormal findings: Secondary | ICD-10-CM

## 2019-09-18 MED ORDER — CARVEDILOL 3.125 MG PO TABS: ORAL_TABLET | ORAL | 2 refills | Status: DC

## 2019-09-18 NOTE — Patient Instructions (Addendum)
Mediterranean Diet A Mediterranean diet refers to food and lifestyle choices that are based on the traditions of countries located on the [employer]. This way of eating has been shown to help prevent certain conditions and improve outcomes for people who have chronic diseases, like kidney disease and heart disease. What are tips for following this plan? Lifestyle  Cook and eat meals together with your family, when possible.  Drink enough fluid to keep your urine clear or pale yellow.  Be physically active every day. This includes: ? Aerobic exercise like running or swimming. ? Leisure activities like gardening, walking, or housework.  Get 7-8 hours of sleep each night.  If recommended by your health care provider, drink red wine in moderation. This means 1 glass a day for nonpregnant women and 2 glasses a day for men. A glass of wine equals 5 oz (150 mL). Reading food labels   Check the serving size of packaged foods. For foods such as rice and pasta, the serving size refers to the amount of cooked product, not dry.  Check the total fat in packaged foods. Avoid foods that have saturated fat or trans fats.  Check the ingredients list for added sugars, such as corn syrup. Shopping  At the grocery store, buy most of your food from the areas near the walls of the store. This includes: ? Fresh fruits and vegetables (produce). ? Grains, beans, nuts, and seeds. Some of these may be available in unpackaged forms or large amounts (in bulk). ? Fresh seafood. ? Poultry and eggs. ? Low-fat dairy products.  Buy whole ingredients instead of prepackaged foods.  Buy fresh fruits and vegetables in-season from local farmers markets.  Buy frozen fruits and vegetables in resealable bags.  If you do not have access to quality fresh seafood, buy precooked frozen shrimp or canned fish, such as tuna, salmon, or sardines.  Buy small amounts of raw or cooked vegetables, salads, or olives from  the deli or salad bar at your store.  Stock your pantry so you always have certain foods on hand, such as olive oil, canned tuna, canned tomatoes, rice, pasta, and beans. Cooking  Cook foods with extra-virgin olive oil instead of using butter or other vegetable oils.  Have meat as a side dish, and have vegetables or grains as your main dish. This means having meat in small portions or adding small amounts of meat to foods like pasta or stew.  Use beans or vegetables instead of meat in common dishes like chili or lasagna.  Experiment with different cooking methods. Try roasting or broiling vegetables instead of steaming or sauteing them.  Add frozen vegetables to soups, stews, pasta, or rice.  Add nuts or seeds for added healthy fat at each meal. You can add these to yogurt, salads, or vegetable dishes.  Marinate fish or vegetables using olive oil, lemon juice, garlic, and fresh herbs. Meal planning   Plan to eat 1 vegetarian meal one day each week. Try to work up to 2 vegetarian meals, if possible.  Eat seafood 2 or more times a week.  Have healthy snacks readily available, such as: ? Vegetable sticks with hummus. ? [place] yogurt. ? Fruit and nut trail mix.  Eat balanced meals throughout the week. This includes: ? Fruit: 2-3 servings a day ? Vegetables: 4-5 servings a day ? Low-fat dairy: 2 servings a day ? Fish, poultry, or lean meat: 1 serving a day ? Beans and legumes: 2 or more servings a week ?  Nuts and seeds: 1-2 servings a day ? Whole grains: 6-8 servings a day ? Extra-virgin olive oil: 3-4 servings a day  Limit red meat and sweets to only a few servings a month What are my food choices?  Mediterranean diet ? Recommended  Grains: Whole-grain pasta. Brown rice. Bulgar wheat. Polenta. Couscous. Whole-wheat bread. [place].  Vegetables: Artichokes. Beets. Broccoli. Cabbage. Carrots. Eggplant. Green beans. Chard. Kale. Spinach. Onions. Leeks. Peas. Squash.  Tomatoes. Peppers. Radishes.  Fruits: Apples. Apricots. Avocado. Berries. Bananas. Cherries. Dates. Figs. Grapes. Lemons. Melon. Oranges. Peaches. Plums. Pomegranate.  Meats and other protein foods: Beans. Almonds. Sunflower seeds. Pine nuts. Peanuts. [place]. Salmon. Scallops. Shrimp. Ruth. Tilapia. Clams. Oysters. Eggs.  Dairy: Low-fat milk. Cheese. Greek yogurt.  Beverages: Water. Red wine. Herbal tea.  Fats and oils: Extra virgin olive oil. Avocado oil. Grape seed oil.  Sweets and desserts: [place] yogurt with honey. Baked apples. Poached pears. Trail mix.  Seasoning and other foods: Basil. Cilantro. Coriander. Cumin. Mint. Parsley. Sage. Rosemary. Tarragon. Garlic. Oregano. Thyme. Pepper. Balsalmic vinegar. Tahini. Hummus. Tomato sauce. Olives. Mushrooms. ? Limit these  Grains: Prepackaged pasta or rice dishes. Prepackaged cereal with added sugar.  Vegetables: Deep fried potatoes (french fries).  Fruits: Fruit canned in syrup.  Meats and other protein foods: Beef. Pork. Lamb. Poultry with skin. Hot dogs. Berniece Salines.  Dairy: Ice cream. Sour cream. Whole milk.  Beverages: Juice. Sugar-sweetened soft drinks. Beer. Liquor and spirits.  Fats and oils: Butter. Canola oil. Vegetable oil. Beef fat (tallow). Lard.  Sweets and desserts: Cookies. Cakes. Pies. Candy.  Seasoning and other foods: Mayonnaise. Premade sauces and marinades. The items listed may not be a complete list. Talk with your dietitian about what dietary choices are right for you. Summary  The Mediterranean diet includes both food and lifestyle choices.  Eat a variety of fresh fruits and vegetables, beans, nuts, seeds, and whole grains.  Limit the amount of red meat and sweets that you eat.  Talk with your health care provider about whether it is safe for you to drink red wine in moderation. This means 1 glass a day for nonpregnant women and 2 glasses a day for men. A glass of wine equals 5 oz (150 mL). This information  is not intended to replace advice given to you by your health care provider. Make sure you discuss any questions you have with your health care provider. Document Revised: 03/24/2016 Document Reviewed: 03/17/2016 Elsevier Patient Education  2020 [place] ordered. Mammogram ordered. Optometry referral placed. Continue all medications as directed. Remain well hydrated, follow Continue regular exercise.  Recommend at least 30 minutes daily, 5 days per week of walking,, biking, swimming, YouTube/Pinterest workout videos. Basic information for mRNA COVID-19 vaccines Please note that these CDC guidelines can change from week to week, and even day to day.  Refer to the [facility] website for the most current recommendations for any given day at [email or web address].ee   1) Under the EUAs (emergency use authorization), the following age groups are authorized to receive the COVID-19 vaccination: . Pfizer-BioNTech: ages ?16 years . Moderna: ages ?18 years . Children and adolescents outside of these authorized age groups should not receive COVID-19 vaccination at this time   2) You are not eligible to receive a COVID-19 vaccine if: . You have received another vaccine in the last 14-days (example: flu shot, shingles, tetanus, etc.) . You currently have a positive test for COVID-19. The vaccine must be deferred until you have recovered from the  acute illness and the criteria has been met for you to discontinue isolation. . You have received passive antibody therapy (monoclonal antibodies or convalescent serum) as treatment for COVID-19 within the past 90 days   3) Currently COVID-19 vaccines are contraindicated in patients who have had an anaphylactic reaction to polyethylene glycol ( ie. GoLYTELY bowel prep or MiraLAX ) or polysorbate products (often used as a thickening agent for many ice creams, frozen custard and whipped dessert products etc).   Also, if you had an  anaphylactic reaction to your first COVID-19 vaccination, you cannot receive your second.     - Also if you are currently pregnant please check with your obstetrician to see what their recommendations are regarding vaccination.   - Also, if you have a chronic condition such as CLL, RA or other immunocompromising conditions, please know that no data are currently available (or very limited data) on the safety and efficacy of mRNA COVID-19 vaccines in persons with autoimmune/ immunocompromising conditions.   Please seek the advice of your specialist regarding whether or not you should receive your COVID-19 vaccination.   Please call [phone] to schedule COVID-19 vaccine.  Follow-up in  3 months for physical with fasting labs.  GREAT TO SEE YOU!

## 2019-09-18 NOTE — Assessment & Plan Note (Signed)
Cologuard ordered. Mammogram ordered. Optometry referral placed. Continue all medications as directed. Remain well hydrated, follow Continue regular exercise.  Recommend at least 30 minutes daily, 5 days per week of walking,, biking, swimming, YouTube/Pinterest workout videos. Basic information for mRNA COVID-19 vaccines Please note that these CDC guidelines can change from week to week, and even day to day.  Refer to the [facility] website for the most current recommendations for any given day at [email or web address]   1) Under the EUAs (emergency use authorization), the following age groups are authorized to receive the COVID-19 vaccination: . Pfizer-BioNTech: ages ?16 years . Moderna: ages ?18 years . Children and adolescents outside of these authorized age groups should not receive COVID-19 vaccination at this time   2) You are not eligible to receive a COVID-19 vaccine if: . You have received another vaccine in the last 14-days (example: flu shot, shingles, tetanus, etc.) . You currently have a positive test for COVID-19. The vaccine must be deferred until you have recovered from the acute illness and the criteria has been met for you to discontinue isolation. . You have received passive antibody therapy (monoclonal antibodies or convalescent serum) as treatment for COVID-19 within the past 90 days   3) Currently COVID-19 vaccines are contraindicated in patients who have had an anaphylactic reaction to polyethylene glycol ( ie. GoLYTELY bowel prep or MiraLAX ) or polysorbate products (often used as a thickening agent for many ice creams, frozen custard and whipped dessert products etc).   Also, if you had an anaphylactic reaction to your first COVID-19 vaccination, you cannot receive your second.     - Also if you are currently pregnant please check with your obstetrician to see what their recommendations are regarding vaccination.   - Also, if you have a chronic condition  such as CLL, RA or other immunocompromising conditions, please know that no data are currently available (or very limited data) on the safety and efficacy of mRNA COVID-19 vaccines in persons with autoimmune/ immunocompromising conditions.   Please seek the advice of your specialist regarding whether or not you should receive your COVID-19 vaccination.   Please call [phone] to schedule COVID-19 vaccine.  Follow-up in  3 months for physical with fasting labs.

## 2019-09-18 NOTE — Addendum Note (Signed)
Addended by: William Hamburger D on: 09/18/2019 05:16 PM   Modules accepted: Orders

## 2019-09-23 ENCOUNTER — Ambulatory Visit (INDEPENDENT_AMBULATORY_CARE_PROVIDER_SITE_OTHER): Payer: Medicare Other | Admitting: Psychology

## 2019-09-23 DIAGNOSIS — F411 Generalized anxiety disorder: Secondary | ICD-10-CM | POA: Diagnosis not present

## 2019-10-03 DIAGNOSIS — H524 Presbyopia: Secondary | ICD-10-CM | POA: Diagnosis not present

## 2019-10-03 DIAGNOSIS — H5203 Hypermetropia, bilateral: Secondary | ICD-10-CM | POA: Diagnosis not present

## 2019-10-03 DIAGNOSIS — H35372 Puckering of macula, left eye: Secondary | ICD-10-CM | POA: Diagnosis not present

## 2019-10-06 ENCOUNTER — Ambulatory Visit: Payer: Medicare Other | Attending: Internal Medicine

## 2019-10-06 DIAGNOSIS — Z23 Encounter for immunization: Secondary | ICD-10-CM

## 2019-10-06 NOTE — Progress Notes (Signed)
   Covid-19 Vaccination Clinic  Name:  KEIDY THURGOOD    MRN: [id number] DOB: 09/27/1950  10/06/2019  Ms. Dery was observed post Covid-19 immunization for 15 minutes without incidence. She was provided with Vaccine Information Sheet and instruction to access the V-Safe system.   Ms. Maille was instructed to call 911 with any severe reactions post vaccine: Marland Kitchen Difficulty breathing  . Swelling of your face and throat  . A fast heartbeat  . A bad rash all over your body  . Dizziness and weakness    Immunizations Administered    Name Date Dose VIS Date Route   Pfizer COVID-19 Vaccine 10/06/2019 11:09 AM 0.3 mL 07/19/2019 Intramuscular   Manufacturer: [employer], [employer]   Lot: [id number]   NDC: [address]-5

## 2019-10-07 ENCOUNTER — Ambulatory Visit (INDEPENDENT_AMBULATORY_CARE_PROVIDER_SITE_OTHER): Payer: Medicare Other | Admitting: Psychology

## 2019-10-07 DIAGNOSIS — F411 Generalized anxiety disorder: Secondary | ICD-10-CM

## 2019-10-21 ENCOUNTER — Ambulatory Visit (INDEPENDENT_AMBULATORY_CARE_PROVIDER_SITE_OTHER): Payer: Medicare Other | Admitting: Psychology

## 2019-10-21 DIAGNOSIS — F411 Generalized anxiety disorder: Secondary | ICD-10-CM

## 2019-10-23 DIAGNOSIS — Z1211 Encounter for screening for malignant neoplasm of colon: Secondary | ICD-10-CM | POA: Diagnosis not present

## 2019-10-23 LAB — COLOGUARD: Cologuard: NEGATIVE

## 2019-10-28 LAB — COLOGUARD: COLOGUARD: NEGATIVE

## 2019-10-29 NOTE — Progress Notes (Signed)
Pt informed of Cologuard results.  Pt expressed understanding.  Pt also requested information for scheduling mammogram.  Pt provided with [facility]'s phone number to schedule appt.  Order is already in the system.  Tiajuana Amass, CMA

## 2019-10-30 ENCOUNTER — Ambulatory Visit: Payer: Medicare Other | Attending: Internal Medicine

## 2019-10-30 DIAGNOSIS — Z23 Encounter for immunization: Secondary | ICD-10-CM

## 2019-10-30 NOTE — Progress Notes (Signed)
   Covid-19 Vaccination Clinic  Name:  TASHAY BOZICH    MRN: [id number] DOB: February 02, 1951  10/30/2019  Ms. Folks was observed post Covid-19 immunization for 15 minutes without incident. She was provided with Vaccine Information Sheet and instruction to access the V-Safe system.   Ms. Callegari was instructed to call 911 with any severe reactions post vaccine: Marland Kitchen Difficulty breathing  . Swelling of face and throat  . A fast heartbeat  . A bad rash all over body  . Dizziness and weakness   Immunizations Administered    Name Date Dose VIS Date Route   Pfizer COVID-19 Vaccine 10/30/2019  3:19 PM 0.3 mL 07/19/2019 Intramuscular   Manufacturer: [employer], [employer]   Lot: [id number]   NDC: [address]-0

## 2019-11-04 ENCOUNTER — Ambulatory Visit (INDEPENDENT_AMBULATORY_CARE_PROVIDER_SITE_OTHER): Payer: Medicare Other | Admitting: Psychology

## 2019-11-04 DIAGNOSIS — F411 Generalized anxiety disorder: Secondary | ICD-10-CM

## 2019-11-18 ENCOUNTER — Ambulatory Visit (INDEPENDENT_AMBULATORY_CARE_PROVIDER_SITE_OTHER): Payer: Medicare Other | Admitting: Psychology

## 2019-11-18 DIAGNOSIS — F411 Generalized anxiety disorder: Secondary | ICD-10-CM

## 2019-12-02 ENCOUNTER — Ambulatory Visit (INDEPENDENT_AMBULATORY_CARE_PROVIDER_SITE_OTHER): Payer: Medicare Other | Admitting: Psychology

## 2019-12-02 DIAGNOSIS — F411 Generalized anxiety disorder: Secondary | ICD-10-CM

## 2019-12-16 ENCOUNTER — Ambulatory Visit (INDEPENDENT_AMBULATORY_CARE_PROVIDER_SITE_OTHER): Payer: Medicare Other | Admitting: Psychology

## 2019-12-16 DIAGNOSIS — F411 Generalized anxiety disorder: Secondary | ICD-10-CM

## 2019-12-30 ENCOUNTER — Ambulatory Visit (INDEPENDENT_AMBULATORY_CARE_PROVIDER_SITE_OTHER): Payer: Medicare Other | Admitting: Psychology

## 2019-12-30 ENCOUNTER — Ambulatory Visit: Payer: Medicare Other | Admitting: Psychology

## 2019-12-30 DIAGNOSIS — F411 Generalized anxiety disorder: Secondary | ICD-10-CM

## 2020-01-10 ENCOUNTER — Other Ambulatory Visit: Payer: Self-pay | Admitting: Adult Health

## 2020-01-13 ENCOUNTER — Ambulatory Visit (INDEPENDENT_AMBULATORY_CARE_PROVIDER_SITE_OTHER): Payer: Medicare Other | Admitting: Psychology

## 2020-01-13 ENCOUNTER — Other Ambulatory Visit: Payer: Self-pay | Admitting: Adult Health

## 2020-01-13 ENCOUNTER — Other Ambulatory Visit: Payer: Self-pay | Admitting: Physician Assistant

## 2020-01-13 DIAGNOSIS — F411 Generalized anxiety disorder: Secondary | ICD-10-CM

## 2020-01-13 MED ORDER — CARVEDILOL 3.125 MG PO TABS: ORAL_TABLET | ORAL | 0 refills | Status: DC

## 2020-01-13 NOTE — Telephone Encounter (Signed)
Forwarding request to med asst that patient is out of :   carvedilol (COREG) 3.125 MG tablet [[id number]]   Order Details Dose, Route, Frequency: As Directed  Dispense Quantity: 60 tablet Refills: 2   Note to Pharmacy: PATIENT MUST HAVE OFFICE VISIT PRIOR TO ANY FURTHER REFILLS      Sig: TAKE 1 TABLET BY MOUTH TWICE DAILY WITH A MEAL   Please send refill order to :   Preferred Pharmacies      [facility] - [place] - [address] [address] [phone] (Phone) [phone] (Fax)   --Patient also scheduled required OV   -glh

## 2020-01-13 NOTE — Telephone Encounter (Signed)
Refill sent to pharmacy, AS, CMA 

## 2020-01-27 ENCOUNTER — Ambulatory Visit (INDEPENDENT_AMBULATORY_CARE_PROVIDER_SITE_OTHER): Payer: Medicare Other | Admitting: Psychology

## 2020-01-27 DIAGNOSIS — F411 Generalized anxiety disorder: Secondary | ICD-10-CM

## 2020-01-31 ENCOUNTER — Ambulatory Visit
Admission: RE | Admit: 2020-01-31 | Discharge: 2020-01-31 | Disposition: A | Discharge status: Home / Self Care | Payer: Medicare Other | Source: Ambulatory Visit | Attending: Adult Health | Admitting: Adult Health

## 2020-01-31 ENCOUNTER — Other Ambulatory Visit: Payer: Self-pay

## 2020-01-31 DIAGNOSIS — Z1231 Encounter for screening mammogram for malignant neoplasm of breast: Secondary | ICD-10-CM

## 2020-02-04 ENCOUNTER — Ambulatory Visit (INDEPENDENT_AMBULATORY_CARE_PROVIDER_SITE_OTHER): Payer: Medicare Other | Admitting: Physician Assistant

## 2020-02-04 ENCOUNTER — Encounter: Payer: Self-pay | Admitting: Physician Assistant

## 2020-02-04 ENCOUNTER — Other Ambulatory Visit: Payer: Self-pay

## 2020-02-04 VITALS — BP 153/83 | HR 70 | Temp 98.7°F | Ht 64.0 in | Wt 164.2 lb

## 2020-02-04 DIAGNOSIS — I10 Essential (primary) hypertension: Secondary | ICD-10-CM | POA: Diagnosis not present

## 2020-02-04 DIAGNOSIS — F411 Generalized anxiety disorder: Secondary | ICD-10-CM

## 2020-02-04 MED ORDER — CARVEDILOL 3.125 MG PO TABS: ORAL_TABLET | ORAL | 1 refills | Status: DC

## 2020-02-04 NOTE — Assessment & Plan Note (Addendum)
-  Stable. -Continue to follow-up with [facility]- Dr. Felipa Furnace. -Continue mindfulness therapy and yoga.

## 2020-02-04 NOTE — Progress Notes (Signed)
Established Patient Office Visit  Subjective:  Patient ID: Amanda [name], female    DOB: 02/03/1951  Age: 68 y.o. MRN: [id number]  CC:  Chief Complaint  Patient presents with  . Hypertension    HPI Amanda [name] presents for follow-up on hypertension.  HTN: Pt denies chest pain, palpitations, dizziness or lower extremity swelling. Taking medication as directed without side effects. Does not check BP at home. Pt follows a low salt diet.  She does yoga on a daily basis.  GAD: Reports few episodes of anxiety. She continues to follow-up with Dr. Felipa Furnace.  States she uses the calm app which has been very helpful and effective.  Past Medical History:  Diagnosis Date  . Hypertension   . Jaw fracture [facility])     Past Surgical History:  Procedure Laterality Date  . BREAST SURGERY     "crystals"  . CESAREAN SECTION    . LEG SURGERY    . SPLENECTOMY      Family History  Problem Relation Age of Onset  . Heart attack Mother   . Hyperlipidemia Mother   . Hypertension Mother   . Heart disease Mother     Social History   Socioeconomic History  . Marital status: Divorced    Spouse name: Not on file  . Number of children: Not on file  . Years of education: Not on file  . Highest education level: Not on file  Occupational History  . Not on file  Tobacco Use  . Smoking status: Current Every Day Smoker    Packs/day: 0.25    Years: 10.00    Pack years: 2.50    Types: Cigarettes  . Smokeless tobacco: Never Used  Vaping Use  . Vaping Use: Never used  Substance and Sexual Activity  . Alcohol use: Yes    Alcohol/week: 1.0 standard drink    Types: 1 Glasses of wine per week  . Drug use: Never  . Sexual activity: Not Currently  Other Topics Concern  . Not on file  Social History Narrative  . Not on file   Social Determinants of Health   Financial Resource Strain:   . Difficulty of Paying Living Expenses:   Food Insecurity:   . Worried About Brewing technologist in the Last Year:   . Barista in the Last Year:   Transportation Needs:   . Freight forwarder (Medical):   Marland Kitchen Lack of Transportation (Non-Medical):   Physical Activity:   . Days of Exercise per Week:   . Minutes of Exercise per Session:   Stress:   . Feeling of Stress :   Social Connections:   . Frequency of Communication with Friends and Family:   . Frequency of Social Gatherings with Friends and Family:   . Attends Religious Services:   . Active Member of Clubs or Organizations:   . Attends Banker Meetings:   Marland Kitchen Marital Status:   Intimate Partner Violence:   . Fear of Current or Ex-Partner:   . Emotionally Abused:   Marland Kitchen Physically Abused:   . Sexually Abused:     Outpatient Medications Prior to Visit  Medication Sig Dispense Refill  . nicotine (NICODERM CQ - DOSED IN MG/24 HOURS) 14 mg/24hr patch Place 1 patch (14 mg total) onto the skin daily. 28 patch 0  . carvedilol (COREG) 3.125 MG tablet TAKE 1 TABLET BY MOUTH TWICE DAILY WITH A MEAL 60 tablet 0   No facility-administered  medications prior to visit.    Allergies  Allergen Reactions  . Atorvastatin Nausea And Vomiting  . Penicillins Hives    ROS Review of Systems  A fourteen system review of systems was performed and found to be positive as per HPI.   Objective:    Physical Exam General:  Well Developed, well nourished, appropriate for stated age.  Neuro:  Alert and oriented,  extra-ocular muscles intact  HEENT:  Normocephalic, atraumatic, neck supple Skin:  no gross rash, warm, pink. Cardiac:  RRR, S1 S2 Respiratory:  ECTA B/L and A/P, Not using accessory muscles, speaking in full sentences- unlabored. Vascular:  Ext warm, no cyanosis apprec.; no edema present Psych:  No HI/SI, judgement and insight good, Euthymic mood. Full Affect.  BP (!) 153/83   Pulse 70   Temp 98.7 F (37.1 C) (Oral)   Ht 5\' 4"  (1.626 m)   Wt 164 lb 3.2 oz (74.5 kg)   SpO2 96%   BMI 28.18 kg/m  Wt  Readings from Last 3 Encounters:  02/04/20 164 lb 3.2 oz (74.5 kg)  09/18/19 159 lb 9.6 oz (72.4 kg)  07/26/18 153 lb 14.4 oz (69.8 kg)     Health Maintenance Due  Topic Date Due  . Hepatitis C Screening  Never done  . TETANUS/TDAP  Never done  . MAMMOGRAM  Never done  . DEXA SCAN  Never done  . PNA vac Low Risk Adult (1 of 2 - PCV13) Never done    There are no preventive care reminders to display for this patient.  Lab Results  Component Value Date   TSH 1.810 10/25/2018   Lab Results  Component Value Date   WBC 7.7 10/25/2018   HGB 13.6 10/25/2018   HCT 40.1 10/25/2018   MCV 95 10/25/2018   PLT 446 10/25/2018   Lab Results  Component Value Date   NA 142 10/25/2018   K 4.3 10/25/2018   CO2 23 10/25/2018   GLUCOSE 85 10/25/2018   BUN 12 10/25/2018   CREATININE 0.65 10/25/2018   BILITOT 0.3 10/25/2018   ALKPHOS 79 10/25/2018   AST 16 10/25/2018   ALT 18 10/25/2018   PROT 6.2 10/25/2018   ALBUMIN 4.0 10/25/2018   CALCIUM 9.7 10/25/2018   Lab Results  Component Value Date   CHOL 219 (H) 10/25/2018   Lab Results  Component Value Date   HDL 56 10/25/2018   Lab Results  Component Value Date   LDLCALC 138 (H) 10/25/2018   Lab Results  Component Value Date   TRIG 126 10/25/2018   Lab Results  Component Value Date   CHOLHDL 3.9 10/25/2018   Lab Results  Component Value Date   HGBA1C 5.7 (H) 10/25/2018      Assessment & Plan:   Problem List Items Addressed This Visit      Cardiovascular and Mediastinum   Hypertension - Primary (Chronic)    - BP today is 151/74, above goal. Reports she recently took her blood pressure medicine. - Continue carvedilol 3.125 mg BID. Provided refills. -Encourage ambulatory BP and pulse monitoring and notify the clinic if BP consistently above 140/90. - Continue DASH diet and physical activity- yoga.        Relevant Medications   carvedilol (COREG) 3.125 MG tablet     Other   GAD (generalized anxiety disorder)     -Stable. -Continue to follow-up with [facility]- Dr. 10/27/2018. -Continue mindfulness therapy and yoga.          Meds  ordered this encounter  Medications  . carvedilol (COREG) 3.125 MG tablet    Sig: TAKE 1 TABLET BY MOUTH TWICE DAILY WITH A MEAL    Dispense:  90 tablet    Refill:  1    PATIENT MUST HAVE OFFICE VISIT PRIOR TO ANY FURTHER REFILLS    Order Specific Question:   Supervising Provider    Answer:   Nani Gasser D [[id number]]    Follow-up: Return for CPE and FBW few days OV in 3-4 months.    Mayer Masker, PA-C

## 2020-02-04 NOTE — Patient Instructions (Signed)
DASH Eating Plan DASH stands for "Dietary Approaches to Stop Hypertension." The DASH eating plan is a healthy eating plan that has been shown to reduce high blood pressure (hypertension). It may also reduce your risk for type 2 diabetes, heart disease, and stroke. The DASH eating plan may also help with weight loss. What are tips for following this plan?  General guidelines  Avoid eating more than 2,300 mg (milligrams) of salt (sodium) a day. If you have hypertension, you may need to reduce your sodium intake to 1,500 mg a day.  Limit alcohol intake to no more than 1 drink a day for nonpregnant women and 2 drinks a day for men. One drink equals 12 oz of beer, 5 oz of wine, or 1 oz of hard liquor.  Work with your health care provider to maintain a healthy body weight or to lose weight. Ask what an ideal weight is for you.  Get at least 30 minutes of exercise that causes your heart to beat faster (aerobic exercise) most days of the week. Activities may include walking, swimming, or biking.  Work with your health care provider or diet and nutrition specialist (dietitian) to adjust your eating plan to your individual calorie needs. Reading food labels   Check food labels for the amount of sodium per serving. Choose foods with less than 5 percent of the Daily Value of sodium. Generally, foods with less than 300 mg of sodium per serving fit into this eating plan.  To find whole grains, look for the word "whole" as the first word in the ingredient list. Shopping  Buy products labeled as "low-sodium" or "no salt added."  Buy fresh foods. Avoid canned foods and premade or frozen meals. Cooking  Avoid adding salt when cooking. Use salt-free seasonings or herbs instead of table salt or sea salt. Check with your health care provider or pharmacist before using salt substitutes.  Do not fry foods. Cook foods using healthy methods such as baking, boiling, grilling, and broiling instead.  Cook with  heart-healthy oils, such as olive, canola, soybean, or sunflower oil. Meal planning  Eat a balanced diet that includes: ? 5 or more servings of fruits and vegetables each day. At each meal, try to fill half of your plate with fruits and vegetables. ? Up to 6-8 servings of whole grains each day. ? Less than 6 oz of lean meat, poultry, or fish each day. A 3-oz serving of meat is about the same size as a deck of cards. One egg equals 1 oz. ? 2 servings of low-fat dairy each day. ? A serving of nuts, seeds, or beans 5 times each week. ? Heart-healthy fats. Healthy fats called Omega-3 fatty acids are found in foods such as flaxseeds and coldwater fish, like sardines, salmon, and mackerel.  Limit how much you eat of the following: ? Canned or prepackaged foods. ? Food that is high in trans fat, such as fried foods. ? Food that is high in saturated fat, such as fatty meat. ? Sweets, desserts, sugary drinks, and other foods with added sugar. ? Full-fat dairy products.  Do not salt foods before eating.  Try to eat at least 2 vegetarian meals each week.  Eat more home-cooked food and less restaurant, buffet, and fast food.  When eating at a restaurant, ask that your food be prepared with less salt or no salt, if possible. What foods are recommended? The items listed may not be a complete list. Talk with your dietitian about   what dietary choices are best for you. Grains Whole-grain or whole-wheat bread. Whole-grain or whole-wheat pasta. Brown rice. Oatmeal. Quinoa. Bulgur. Whole-grain and low-sodium cereals. Pita bread. Low-fat, low-sodium crackers. Whole-wheat flour tortillas. Vegetables Fresh or frozen vegetables (raw, steamed, roasted, or grilled). Low-sodium or reduced-sodium tomato and vegetable juice. Low-sodium or reduced-sodium tomato sauce and tomato paste. Low-sodium or reduced-sodium canned vegetables. Fruits All fresh, dried, or frozen fruit. Canned fruit in natural juice (without  added sugar). Meat and other protein foods Skinless chicken or turkey. Ground chicken or turkey. Pork with fat trimmed off. Fish and seafood. Egg whites. Dried beans, peas, or lentils. Unsalted nuts, nut butters, and seeds. Unsalted canned beans. Lean cuts of beef with fat trimmed off. Low-sodium, lean deli meat. Dairy Low-fat (1%) or fat-free (skim) milk. Fat-free, low-fat, or reduced-fat cheeses. Nonfat, low-sodium ricotta or cottage cheese. Low-fat or nonfat yogurt. Low-fat, low-sodium cheese. Fats and oils Soft margarine without trans fats. Vegetable oil. Low-fat, reduced-fat, or light mayonnaise and salad dressings (reduced-sodium). Canola, safflower, olive, soybean, and sunflower oils. Avocado. Seasoning and other foods Herbs. Spices. Seasoning mixes without salt. Unsalted popcorn and pretzels. Fat-free sweets. What foods are not recommended? The items listed may not be a complete list. Talk with your dietitian about what dietary choices are best for you. Grains Baked goods made with fat, such as croissants, muffins, or some breads. Dry pasta or rice meal packs. Vegetables Creamed or fried vegetables. Vegetables in a cheese sauce. Regular canned vegetables (not low-sodium or reduced-sodium). Regular canned tomato sauce and paste (not low-sodium or reduced-sodium). Regular tomato and vegetable juice (not low-sodium or reduced-sodium). Pickles. Olives. Fruits Canned fruit in a light or heavy syrup. Fried fruit. Fruit in cream or butter sauce. Meat and other protein foods Fatty cuts of meat. Ribs. Fried meat. Bacon. Sausage. Bologna and other processed lunch meats. Salami. Fatback. Hotdogs. Bratwurst. Salted nuts and seeds. Canned beans with added salt. Canned or smoked fish. Whole eggs or egg yolks. Chicken or turkey with skin. Dairy Whole or 2% milk, cream, and half-and-half. Whole or full-fat cream cheese. Whole-fat or sweetened yogurt. Full-fat cheese. Nondairy creamers. Whipped toppings.  Processed cheese and cheese spreads. Fats and oils Butter. Stick margarine. Lard. Shortening. Ghee. Bacon fat. Tropical oils, such as coconut, palm kernel, or palm oil. Seasoning and other foods Salted popcorn and pretzels. Onion salt, garlic salt, seasoned salt, table salt, and sea salt. Worcestershire sauce. Tartar sauce. Barbecue sauce. Teriyaki sauce. Soy sauce, including reduced-sodium. Steak sauce. Canned and packaged gravies. Fish sauce. Oyster sauce. Cocktail sauce. Horseradish that you find on the shelf. Ketchup. Mustard. Meat flavorings and tenderizers. Bouillon cubes. Hot sauce and Tabasco sauce. Premade or packaged marinades. Premade or packaged taco seasonings. Relishes. Regular salad dressings. Where to find more information:  National Heart, Lung, and Blood Institute: www.nhlbi.nih.gov  American Heart Association: www.heart.org Summary  The DASH eating plan is a healthy eating plan that has been shown to reduce high blood pressure (hypertension). It may also reduce your risk for type 2 diabetes, heart disease, and stroke.  With the DASH eating plan, you should limit salt (sodium) intake to 2,300 mg a day. If you have hypertension, you may need to reduce your sodium intake to 1,500 mg a day.  When on the DASH eating plan, aim to eat more fresh fruits and vegetables, whole grains, lean proteins, low-fat dairy, and heart-healthy fats.  Work with your health care provider or diet and nutrition specialist (dietitian) to adjust your eating plan to your   individual calorie needs. This information is not intended to replace advice given to you by your health care provider. Make sure you discuss any questions you have with your health care provider. Document Revised: 07/07/2017 Document Reviewed: 07/18/2016 Elsevier Patient Education  2020 [employer]vier Inc.  

## 2020-02-04 NOTE — Assessment & Plan Note (Signed)
-   BP today is 151/74, above goal. Reports she recently took her blood pressure medicine. - Continue carvedilol 3.125 mg BID. Provided refills. -Encourage ambulatory BP and pulse monitoring and notify the clinic if BP consistently above 140/90. - Continue DASH diet and physical activity- yoga.

## 2020-02-24 ENCOUNTER — Ambulatory Visit (INDEPENDENT_AMBULATORY_CARE_PROVIDER_SITE_OTHER): Payer: Medicare Other | Admitting: Psychology

## 2020-02-24 DIAGNOSIS — F411 Generalized anxiety disorder: Secondary | ICD-10-CM | POA: Diagnosis not present

## 2020-03-09 ENCOUNTER — Ambulatory Visit (INDEPENDENT_AMBULATORY_CARE_PROVIDER_SITE_OTHER): Payer: Medicare Other | Admitting: Psychology

## 2020-03-09 DIAGNOSIS — F411 Generalized anxiety disorder: Secondary | ICD-10-CM

## 2020-03-23 ENCOUNTER — Ambulatory Visit (INDEPENDENT_AMBULATORY_CARE_PROVIDER_SITE_OTHER): Payer: Medicare Other | Admitting: Psychology

## 2020-03-23 DIAGNOSIS — F411 Generalized anxiety disorder: Secondary | ICD-10-CM

## 2020-04-06 ENCOUNTER — Ambulatory Visit (INDEPENDENT_AMBULATORY_CARE_PROVIDER_SITE_OTHER): Payer: Medicare Other | Admitting: Psychology

## 2020-04-06 DIAGNOSIS — F411 Generalized anxiety disorder: Secondary | ICD-10-CM

## 2020-04-20 ENCOUNTER — Ambulatory Visit (INDEPENDENT_AMBULATORY_CARE_PROVIDER_SITE_OTHER): Payer: Medicare Other | Admitting: Psychology

## 2020-04-20 DIAGNOSIS — F411 Generalized anxiety disorder: Secondary | ICD-10-CM

## 2020-05-04 ENCOUNTER — Ambulatory Visit (INDEPENDENT_AMBULATORY_CARE_PROVIDER_SITE_OTHER): Payer: Medicare Other | Admitting: Psychology

## 2020-05-04 DIAGNOSIS — F411 Generalized anxiety disorder: Secondary | ICD-10-CM | POA: Diagnosis not present

## 2020-05-18 ENCOUNTER — Ambulatory Visit: Payer: Medicare Other | Admitting: Psychology

## 2020-06-01 ENCOUNTER — Ambulatory Visit (INDEPENDENT_AMBULATORY_CARE_PROVIDER_SITE_OTHER): Payer: Medicare Other | Admitting: Psychology

## 2020-06-01 DIAGNOSIS — F411 Generalized anxiety disorder: Secondary | ICD-10-CM | POA: Diagnosis not present

## 2020-06-05 ENCOUNTER — Other Ambulatory Visit: Payer: Self-pay | Admitting: Physician Assistant

## 2020-06-05 DIAGNOSIS — Z0000 Encounter for general adult medical examination without abnormal findings: Secondary | ICD-10-CM

## 2020-06-05 DIAGNOSIS — I10 Essential (primary) hypertension: Secondary | ICD-10-CM

## 2020-06-05 DIAGNOSIS — E7800 Pure hypercholesterolemia, unspecified: Secondary | ICD-10-CM

## 2020-06-08 ENCOUNTER — Other Ambulatory Visit: Payer: Self-pay

## 2020-06-08 ENCOUNTER — Other Ambulatory Visit: Payer: Medicare Other

## 2020-06-08 DIAGNOSIS — Z0000 Encounter for general adult medical examination without abnormal findings: Secondary | ICD-10-CM | POA: Diagnosis not present

## 2020-06-08 DIAGNOSIS — I10 Essential (primary) hypertension: Secondary | ICD-10-CM | POA: Diagnosis not present

## 2020-06-08 DIAGNOSIS — E7800 Pure hypercholesterolemia, unspecified: Secondary | ICD-10-CM | POA: Diagnosis not present

## 2020-06-09 LAB — COMPREHENSIVE METABOLIC PANEL
ALT: 13 IU/L (ref 0–32)
AST: 12 IU/L (ref 0–40)
Albumin/Globulin Ratio: 1.8 (ref 1.2–2.2)
Albumin: 3.9 g/dL (ref 3.7–4.7)
Alkaline Phosphatase: 89 IU/L (ref 44–121)
BUN/Creatinine Ratio: 24 (ref 12–28)
BUN: 15 mg/dL (ref 8–27)
Bilirubin Total: 0.4 mg/dL (ref 0.0–1.2)
CO2: 25 mmol/L (ref 20–29)
Calcium: 9.4 mg/dL (ref 8.7–10.3)
Chloride: 105 mmol/L (ref 96–106)
Creatinine, Ser: 0.62 mg/dL (ref 0.57–1.00)
GFR calc Af Amer: 105 mL/min/{1.73_m2} (ref >59)
GFR calc non Af Amer: 91 mL/min/{1.73_m2} (ref >59)
Globulin, Total: 2.2 g/dL (ref 1.5–4.5)
Glucose: 89 mg/dL (ref 65–99)
Potassium: 4.4 mmol/L (ref 3.5–5.2)
Sodium: 141 mmol/L (ref 134–144)
Total Protein: 6.1 g/dL (ref 6.0–8.5)

## 2020-06-09 LAB — LIPID PANEL
Chol/HDL Ratio: 3.8 ratio (ref 0.0–4.4)
Cholesterol, Total: 224 mg/dL — ABNORMAL HIGH (ref 100–199)
HDL: 59 mg/dL (ref >39)
LDL Chol Calc (NIH): 138 mg/dL — ABNORMAL HIGH (ref 0–99)
Triglycerides: 154 mg/dL — ABNORMAL HIGH (ref 0–149)
VLDL Cholesterol Cal: 27 mg/dL (ref 5–40)

## 2020-06-09 LAB — HEMOGLOBIN A1C
Est. average glucose Bld gHb Est-mCnc: 117 mg/dL
Hgb A1c MFr Bld: 5.7 % — ABNORMAL HIGH (ref 4.8–5.6)

## 2020-06-09 LAB — CBC
Hematocrit: 42.4 % (ref 34.0–46.6)
Hemoglobin: 13.9 g/dL (ref 11.1–15.9)
MCH: 32.5 pg (ref 26.6–33.0)
MCHC: 32.8 g/dL (ref 31.5–35.7)
MCV: 99 fL — ABNORMAL HIGH (ref 79–97)
Platelets: 436 10*3/uL (ref 150–450)
RBC: 4.28 x10E6/uL (ref 3.77–5.28)
RDW: 11.9 % (ref 11.7–15.4)
WBC: 9.2 10*3/uL (ref 3.4–10.8)

## 2020-06-09 LAB — TSH: TSH: 1.77 u[IU]/mL (ref 0.450–4.500)

## 2020-06-11 ENCOUNTER — Ambulatory Visit (INDEPENDENT_AMBULATORY_CARE_PROVIDER_SITE_OTHER): Payer: Medicare Other | Admitting: Physician Assistant

## 2020-06-11 ENCOUNTER — Other Ambulatory Visit: Payer: Self-pay

## 2020-06-11 ENCOUNTER — Encounter: Payer: Self-pay | Admitting: Physician Assistant

## 2020-06-11 VITALS — BP 143/87 | HR 78 | Temp 98.1°F | Ht 63.0 in | Wt 159.4 lb

## 2020-06-11 DIAGNOSIS — Z0000 Encounter for general adult medical examination without abnormal findings: Secondary | ICD-10-CM

## 2020-06-11 DIAGNOSIS — I10 Essential (primary) hypertension: Secondary | ICD-10-CM | POA: Diagnosis not present

## 2020-06-11 MED ORDER — CARVEDILOL 3.125 MG PO TABS: ORAL_TABLET | ORAL | 1 refills | Status: DC

## 2020-06-11 NOTE — Progress Notes (Addendum)
Subjective:   Amanda [name] is a 69 y.o. female who presents for Medicare Annual (Subsequent) preventive examination.  Review of Systems    General:   No F/C, wt loss Pulm:   No DIB, SOB, pleuritic chest pain Card:  No CP, palpitations Abd:  No n/v/d or pain Ext:  No inc edema from baseline     Objective:    Today's Vitals   06/11/20 1021  BP: (!) 143/87  Pulse: 78  Temp: 98.1 F (36.7 C)  TempSrc: Oral  SpO2: 99%  Weight: 159 lb 6.4 oz (72.3 kg)  Height: 5\' 3"  (1.6 m)   Body mass index is 28.24 kg/m.  Advanced Directives 07/26/2018  Does Patient Have a Medical Advance Directive? No  Would patient like information on creating a medical advance directive? No - Patient declined    Current Medications (verified) Outpatient Encounter Medications as of 06/11/2020  Medication Sig  . carvedilol (COREG) 3.125 MG tablet TAKE 1 TABLET BY MOUTH TWICE DAILY WITH A MEAL  . nicotine (NICODERM CQ - DOSED IN MG/24 HOURS) 14 mg/24hr patch Place 1 patch (14 mg total) onto the skin daily.  . [DISCONTINUED] carvedilol (COREG) 3.125 MG tablet TAKE 1 TABLET BY MOUTH TWICE DAILY WITH A MEAL   No facility-administered encounter medications on file as of 06/11/2020.    Allergies (verified) Atorvastatin and Penicillins   History: Past Medical History:  Diagnosis Date  . Hypertension   . Jaw fracture [facility])    Past Surgical History:  Procedure Laterality Date  . BREAST SURGERY     "crystals"  . CESAREAN SECTION    . LEG SURGERY    . SPLENECTOMY     Family History  Problem Relation Age of Onset  . Heart attack Mother   . Hyperlipidemia Mother   . Hypertension Mother   . Heart disease Mother    Social History   Socioeconomic History  . Marital status: Divorced    Spouse name: Not on file  . Number of children: Not on file  . Years of education: Not on file  . Highest education level: Not on file  Occupational History  . Not on file  Tobacco Use  . Smoking  status: Current Every Day Smoker    Packs/day: 0.25    Years: 10.00    Pack years: 2.50    Types: Cigarettes  . Smokeless tobacco: Never Used  Vaping Use  . Vaping Use: Never used  Substance and Sexual Activity  . Alcohol use: Yes    Alcohol/week: 1.0 standard drink    Types: 1 Glasses of wine per week  . Drug use: Never  . Sexual activity: Not Currently  Other Topics Concern  . Not on file  Social History Narrative  . Not on file   Social Determinants of Health   Financial Resource Strain:   . Difficulty of Paying Living Expenses: Not on file  Food Insecurity:   . Worried About [facility] in the Last Year: Not on file  . Ran Out of Food in the Last Year: Not on file  Transportation Needs:   . Lack of Transportation (Medical): Not on file  . Lack of Transportation (Non-Medical): Not on file  Physical Activity:   . Days of Exercise per Week: Not on file  . Minutes of Exercise per Session: Not on file  Stress:   . Feeling of Stress : Not on file  Social Connections:   . Frequency of  Communication with Friends and Family: Not on file  . Frequency of Social Gatherings with Friends and Family: Not on file  . Attends Religious Services: Not on file  . Active Member of Clubs or Organizations: Not on file  . Attends Banker Meetings: Not on file  . Marital Status: Not on file    Tobacco Counseling Ready to quit: Not Answered Counseling given: Not Answered    Diabetic? no         Activities of Daily Living In your present state of health, do you have any difficulty performing the following activities: 06/11/2020 02/04/2020  Hearing? N N  Vision? N N  Difficulty concentrating or making decisions? N N  Walking or climbing stairs? N N  Dressing or bathing? N N  Doing errands, shopping? N N  Some recent data might be hidden    Patient Care Team: Mayer Masker, PA-C as PCP - General (Physician Assistant)  Indicate any recent Medical  Services you may have received from other than [facility] providers in the past year (date may be approximate).     Assessment:   This is a routine wellness examination for Amanda [name].  Hearing/Vision screen No exam data present  Dietary issues and exercise activities discussed:  -Follow a heart healthy diet and continue to stay active. Monitor carbohydrates and glucose.  Goals   None    Depression Screen PHQ 2/9 Scores 06/11/2020 02/04/2020 09/18/2019 07/26/2018  PHQ - 2 Score 0 1 0 0  PHQ- 9 Score 1 2 0 1    Fall Risk Fall Risk  06/11/2020 02/04/2020 07/26/2018  Falls in the past year? 1 0 0  Number falls in past yr: 0 - -  Injury with Fall? 1 - -  Risk for fall due to : History of fall(s) - -  Follow up Falls evaluation completed Falls evaluation completed Falls evaluation completed    Any stairs in or around the home? Yes  If so, are there any without handrails? No  Home free of loose throw rugs in walkways, pet beds, electrical cords, etc? Yes  Adequate lighting in your home to reduce risk of falls? Yes   ASSISTIVE DEVICES UTILIZED TO PREVENT FALLS:  Life alert? No  Use of a cane, walker or w/c? No  Grab bars in the bathroom? No  Shower chair or bench in shower? No  Elevated toilet seat or a handicapped toilet? No   TIMED UP AND GO:  Was the test performed? Yes .  Length of time to ambulate 10 feet: 15 sec.   Gait steady and fast without use of assistive device  Cognitive Function: wnl     6CIT Screen 06/11/2020  What Year? 0 points  What month? 0 points  What time? 0 points  Count back from 20 0 points  Months in reverse 0 points  Repeat phrase 2 points  Total Score 2    Immunizations Immunization History  Administered Date(s) Administered  . PFIZER SARS-COV-2 Vaccination 10/06/2019, 10/30/2019  . Tdap 08/09/2011    TDAP status: Up to date Flu Vaccine status: Declined, Education has been provided regarding the importance of this vaccine but patient still  declined. Advised may receive this vaccine at local pharmacy or Health Dept. Aware to provide a copy of the vaccination record if obtained from local pharmacy or Health Dept. Verbalized acceptance and understanding. Pneumococcal vaccine status: Declined,  Education has been provided regarding the importance of this vaccine but patient still declined. Advised may receive  this vaccine at local pharmacy or Health Dept. Aware to provide a copy of the vaccination record if obtained from local pharmacy or Health Dept. Verbalized acceptance and understanding.  Covid-19 vaccine status: Completed vaccines  Qualifies for Shingles Vaccine? Yes   Zostavax completed No   Shingrix Completed?: No.    Education has been provided regarding the importance of this vaccine. Patient has been advised to call insurance company to determine out of pocket expense if they have not yet received this vaccine. Advised may also receive vaccine at local pharmacy or Health Dept. Verbalized acceptance and understanding.  Screening Tests Health Maintenance  Topic Date Due  . Hepatitis C Screening  Never done  . DEXA SCAN  Never done  . PNA vac Low Risk Adult (1 of 2 - PCV13) Never done  . INFLUENZA VACCINE  Never done  . TETANUS/TDAP  08/08/2021  . MAMMOGRAM  01/30/2022  . Fecal DNA (Cologuard)  10/23/2022  . COVID-19 Vaccine  Completed    Health Maintenance  Health Maintenance Due  Topic Date Due  . Hepatitis C Screening  Never done  . DEXA SCAN  Never done  . PNA vac Low Risk Adult (1 of 2 - PCV13) Never done  . INFLUENZA VACCINE  Never done    Colorectal cancer screening: Completed 10/23/2019. Repeat every 3 years (Cologuard) Mammogram status: Completed 01/31/2020. Repeat every year Bone density:  Pt declined  Lung Cancer Screening: (Low Dose CT Chest recommended if Age 53-80 years, 30 pack-year currently smoking OR have quit w/in 15years.) does not qualify.   Lung Cancer Screening Referral: n/a  Additional  Screening:  Hepatitis C Screening: does qualify; Pt declined  Vision Screening: Recommended annual ophthalmology exams for early detection of glaucoma and other disorders of the eye. Is the patient up to date with their annual eye exam?  Yes  Who is the provider or what is the name of the office in which the patient attends annual eye exams? Pt is unable to remember name of provider.   Dental Screening: Recommended annual dental exams for proper oral hygiene  Community Resource Referral / Chronic Care Management: CRR required this visit?  No   CCM required this visit?  No      Plan:  -Continue current medication regimen. Provided refills. -BP mildly elevated today and advises to monitor BP and pulse at home. If BP consistently >135/85 then instructed to contact the office for medication adjustments.  -Continue with BH therapy sessions and [facility]. -Encourage to continue reducing tobacco use and eventually quit. -Most labs are essentially within normal limits or stable from prior. Lipid panel- total cholesterol, triglycerides, and LDL are elevated. A1c in prediabetes range at 5.7 -Follow up in 6 months for reg OV: HTN  I have personally reviewed and noted the following in the patient's chart:   . Medical and social history . Use of alcohol, tobacco or illicit drugs  . Current medications and supplements . Functional ability and status . Nutritional status . Physical activity . Advanced directives . List of other physicians . Hospitalizations, surgeries, and ER visits in previous 12 months . Vitals . Screenings to include cognitive, depression, and falls . Referrals and appointments  In addition, I have reviewed and discussed with patient certain preventive protocols, quality metrics, and best practice recommendations. A written personalized care plan for preventive services as well as general preventive health recommendations were provided to patient.        06/11/2020

## 2020-06-11 NOTE — Patient Instructions (Signed)
Preventive Care 71 Years and Older, Female Preventive care refers to lifestyle choices and visits with your health care provider that can promote health and wellness. This includes:  A yearly physical exam. This is also called an annual well check.  Regular dental and eye exams.  Immunizations.  Screening for certain conditions.  Healthy lifestyle choices, such as diet and exercise. What can I expect for my preventive care visit? Physical exam Your health care provider will check:  Height and weight. These may be used to calculate body mass index (BMI), which is a measurement that tells if you are at a healthy weight.  Heart rate and blood pressure.  Your skin for abnormal spots. Counseling Your health care provider may ask you questions about:  Alcohol, tobacco, and drug use.  Emotional well-being.  Home and relationship well-being.  Sexual activity.  Eating habits.  History of falls.  Memory and ability to understand (cognition).  Work and work Statistician.  Pregnancy and menstrual history. What immunizations do I need?  Influenza (flu) vaccine  This is recommended every year. Tetanus, diphtheria, and pertussis (Tdap) vaccine  You may need a Td booster every 10 years. Varicella (chickenpox) vaccine  You may need this vaccine if you have not already been vaccinated. Zoster (shingles) vaccine  You may need this after age 71. Pneumococcal conjugate (PCV13) vaccine  One dose is recommended after age 71. Pneumococcal polysaccharide (PPSV23) vaccine  One dose is recommended after age 71. Measles, mumps, and rubella (MMR) vaccine  You may need at least one dose of MMR if you were born in 1957 or later. You may also need a second dose. Meningococcal conjugate (MenACWY) vaccine  You may need this if you have certain conditions. Hepatitis A vaccine  You may need this if you have certain conditions or if you travel or work in places where you may be exposed  to hepatitis A. Hepatitis B vaccine  You may need this if you have certain conditions or if you travel or work in places where you may be exposed to hepatitis B. Haemophilus influenzae type b (Hib) vaccine  You may need this if you have certain conditions. You may receive vaccines as individual doses or as more than one vaccine together in one shot (combination vaccines). Talk with your health care provider about the risks and benefits of combination vaccines. What tests do I need? Blood tests  Lipid and cholesterol levels. These may be checked every 5 years, or more frequently depending on your overall health.  Hepatitis C test.  Hepatitis B test. Screening  Lung cancer screening. You may have this screening every year starting at age 71 if you have a 30-pack-year history of smoking and currently smoke or have quit within the past 15 years.  Colorectal cancer screening. All adults should have this screening starting at age 71 and continuing until age 15. Your health care provider may recommend screening at age 71 if you are at increased risk. You will have tests every 1-10 years, depending on your results and the type of screening test.  Diabetes screening. This is done by checking your blood sugar (glucose) after you have not eaten for a while (fasting). You may have this done every 1-3 years.  Mammogram. This may be done every 1-2 years. Talk with your health care provider about how often you should have regular mammograms.  BRCA-related cancer screening. This may be done if you have a family history of breast, ovarian, tubal, or peritoneal cancers.  Other tests  Sexually transmitted disease (STD) testing.  Bone density scan. This is done to screen for osteoporosis. You may have this done starting at age 71. Follow these instructions at home: Eating and drinking  Eat a diet that includes fresh fruits and vegetables, whole grains, lean protein, and low-fat dairy products. Limit  your intake of foods with high amounts of sugar, saturated fats, and salt.  Take vitamin and mineral supplements as recommended by your health care provider.  Do not drink alcohol if your health care provider tells you not to drink.  If you drink alcohol: ? Limit how much you have to 0-1 drink a day. ? Be aware of how much alcohol is in your drink. In the [place] one drink equals one 12 oz bottle of beer (355 mL), one 5 oz glass of wine (148 mL), or one 1 oz glass of hard liquor (44 mL). Lifestyle  Take daily care of your teeth and gums.  Stay active. Exercise for at least 30 minutes on 5 or more days each week.  Do not use any products that contain nicotine or tobacco, such as cigarettes, e-cigarettes, and chewing tobacco. If you need help quitting, ask your health care provider.  If you are sexually active, practice safe sex. Use a condom or other form of protection in order to prevent STIs (sexually transmitted infections).  Talk with your health care provider about taking a low-dose aspirin or statin. What's next?  Go to your health care provider once a year for a well check visit.  Ask your health care provider how often you should have your eyes and teeth checked.  Stay up to date on all vaccines. This information is not intended to replace advice given to you by your health care provider. Make sure you discuss any questions you have with your health care provider. Document Revised: 07/19/2018 Document Reviewed: 07/19/2018 Elsevier Patient Education  2020 [employer].

## 2020-06-15 ENCOUNTER — Ambulatory Visit (INDEPENDENT_AMBULATORY_CARE_PROVIDER_SITE_OTHER): Payer: Medicare Other | Admitting: Psychology

## 2020-06-15 DIAGNOSIS — F411 Generalized anxiety disorder: Secondary | ICD-10-CM | POA: Diagnosis not present

## 2020-06-29 ENCOUNTER — Ambulatory Visit (INDEPENDENT_AMBULATORY_CARE_PROVIDER_SITE_OTHER): Payer: Medicare Other | Admitting: Psychology

## 2020-06-29 DIAGNOSIS — F411 Generalized anxiety disorder: Secondary | ICD-10-CM | POA: Diagnosis not present

## 2020-07-13 ENCOUNTER — Ambulatory Visit (INDEPENDENT_AMBULATORY_CARE_PROVIDER_SITE_OTHER): Payer: Medicare Other | Admitting: Psychology

## 2020-07-13 DIAGNOSIS — F411 Generalized anxiety disorder: Secondary | ICD-10-CM | POA: Diagnosis not present

## 2020-07-27 ENCOUNTER — Ambulatory Visit: Payer: Medicare Other | Admitting: Psychology

## 2020-08-10 ENCOUNTER — Ambulatory Visit (INDEPENDENT_AMBULATORY_CARE_PROVIDER_SITE_OTHER): Payer: Medicare Other | Admitting: Psychology

## 2020-08-10 DIAGNOSIS — F411 Generalized anxiety disorder: Secondary | ICD-10-CM | POA: Diagnosis not present

## 2020-08-24 ENCOUNTER — Ambulatory Visit: Payer: Medicare Other | Admitting: Psychology

## 2020-08-26 LAB — FECAL OCCULT BLOOD, GUAIAC: Fecal Occult Blood: NEGATIVE

## 2020-09-01 ENCOUNTER — Ambulatory Visit (INDEPENDENT_AMBULATORY_CARE_PROVIDER_SITE_OTHER): Payer: Medicare Other | Admitting: Psychology

## 2020-09-01 DIAGNOSIS — F411 Generalized anxiety disorder: Secondary | ICD-10-CM | POA: Diagnosis not present

## 2020-09-07 ENCOUNTER — Ambulatory Visit: Payer: Medicare Other | Admitting: Psychology

## 2020-09-15 ENCOUNTER — Encounter: Payer: Self-pay | Admitting: Physician Assistant

## 2020-09-21 ENCOUNTER — Ambulatory Visit (INDEPENDENT_AMBULATORY_CARE_PROVIDER_SITE_OTHER): Payer: Medicare Other | Admitting: Psychology

## 2020-09-21 DIAGNOSIS — F411 Generalized anxiety disorder: Secondary | ICD-10-CM

## 2020-10-01 ENCOUNTER — Other Ambulatory Visit: Payer: Self-pay | Admitting: Physician Assistant

## 2020-10-01 DIAGNOSIS — I10 Essential (primary) hypertension: Secondary | ICD-10-CM

## 2020-10-05 ENCOUNTER — Telehealth: Payer: Self-pay | Admitting: Physician Assistant

## 2020-10-05 ENCOUNTER — Ambulatory Visit (INDEPENDENT_AMBULATORY_CARE_PROVIDER_SITE_OTHER): Payer: Medicare Other | Admitting: Psychology

## 2020-10-05 DIAGNOSIS — F411 Generalized anxiety disorder: Secondary | ICD-10-CM

## 2020-10-05 NOTE — Telephone Encounter (Signed)
Patient called in to schedule her follow up appt from 06/2020 OV (12/17/20). I saw there were no labs ordered, but she is pre diabetic and her A1C was elevated- please add orders for her May visit if PCP would like to add- if not, I will cancel scheduled lab on 5/10 and notify patient. Thank you

## 2020-10-05 NOTE — Telephone Encounter (Signed)
Patient is aware of the below and verbalized understanding. AS< CMA 

## 2020-10-05 NOTE — Telephone Encounter (Signed)
No orders in chart per provider. Canceling lab apt. Patient can discuss desired labs with PCP at visit. Pt is aware. AS, CMA

## 2020-10-05 NOTE — Telephone Encounter (Signed)
Last mammogram was 6/21 and advised to follow up in 1 year.

## 2020-10-19 ENCOUNTER — Ambulatory Visit: Payer: Medicare Other | Admitting: Psychology

## 2020-10-21 ENCOUNTER — Ambulatory Visit (INDEPENDENT_AMBULATORY_CARE_PROVIDER_SITE_OTHER): Payer: Medicare Other | Admitting: Psychology

## 2020-10-21 DIAGNOSIS — F411 Generalized anxiety disorder: Secondary | ICD-10-CM | POA: Diagnosis not present

## 2020-11-02 ENCOUNTER — Ambulatory Visit (INDEPENDENT_AMBULATORY_CARE_PROVIDER_SITE_OTHER): Payer: Medicare Other | Admitting: Psychology

## 2020-11-02 DIAGNOSIS — F411 Generalized anxiety disorder: Secondary | ICD-10-CM

## 2020-11-16 ENCOUNTER — Ambulatory Visit (INDEPENDENT_AMBULATORY_CARE_PROVIDER_SITE_OTHER): Payer: Medicare Other | Admitting: Psychology

## 2020-11-16 ENCOUNTER — Ambulatory Visit: Payer: Medicare Other | Admitting: Psychology

## 2020-11-16 DIAGNOSIS — F411 Generalized anxiety disorder: Secondary | ICD-10-CM

## 2020-11-23 ENCOUNTER — Other Ambulatory Visit: Payer: Self-pay | Admitting: Physician Assistant

## 2020-11-23 DIAGNOSIS — I10 Essential (primary) hypertension: Secondary | ICD-10-CM

## 2020-11-30 ENCOUNTER — Ambulatory Visit (INDEPENDENT_AMBULATORY_CARE_PROVIDER_SITE_OTHER): Payer: Medicare Other | Admitting: Psychology

## 2020-11-30 DIAGNOSIS — F411 Generalized anxiety disorder: Secondary | ICD-10-CM

## 2020-12-14 ENCOUNTER — Ambulatory Visit (INDEPENDENT_AMBULATORY_CARE_PROVIDER_SITE_OTHER): Payer: Medicare Other | Admitting: Psychology

## 2020-12-14 DIAGNOSIS — F411 Generalized anxiety disorder: Secondary | ICD-10-CM | POA: Diagnosis not present

## 2020-12-15 ENCOUNTER — Other Ambulatory Visit: Payer: Medicare Other

## 2020-12-17 ENCOUNTER — Ambulatory Visit (INDEPENDENT_AMBULATORY_CARE_PROVIDER_SITE_OTHER): Payer: Medicare Other | Admitting: Physician Assistant

## 2020-12-17 ENCOUNTER — Other Ambulatory Visit: Payer: Self-pay

## 2020-12-17 ENCOUNTER — Encounter: Payer: Self-pay | Admitting: Physician Assistant

## 2020-12-17 VITALS — BP 115/78 | HR 68 | Temp 99.0°F | Ht 65.0 in | Wt 161.0 lb

## 2020-12-17 DIAGNOSIS — Z716 Tobacco abuse counseling: Secondary | ICD-10-CM | POA: Diagnosis not present

## 2020-12-17 DIAGNOSIS — F411 Generalized anxiety disorder: Secondary | ICD-10-CM

## 2020-12-17 DIAGNOSIS — I10 Essential (primary) hypertension: Secondary | ICD-10-CM

## 2020-12-17 NOTE — Patient Instructions (Addendum)
Health Maintenance After Age 72 After age 72, you are at a higher risk for certain long-term diseases and infections as well as injuries from falls. Falls are a major cause of broken bones and head injuries in people who are older than age 72. Getting regular preventive care can help to keep you healthy and well. Preventive care includes getting regular testing and making lifestyle changes as recommended by your health care provider. Talk with your health care provider about:  Which screenings and tests you should have. A screening is a test that checks for a disease when you have no symptoms.  A diet and exercise plan that is right for you. What should I know about screenings and tests to prevent falls? Screening and testing are the best ways to find a health problem early. Early diagnosis and treatment give you the best chance of managing medical conditions that are common after age 72. Certain conditions and lifestyle choices may make you more likely to have a fall. Your health care provider may recommend:  Regular vision checks. Poor vision and conditions such as cataracts can make you more likely to have a fall. If you wear glasses, make sure to get your prescription updated if your vision changes.  Medicine review. Work with your health care provider to regularly review all of the medicines you are taking, including over-the-counter medicines. Ask your health care provider about any side effects that may make you more likely to have a fall. Tell your health care provider if any medicines that you take make you feel dizzy or sleepy.  Osteoporosis screening. Osteoporosis is a condition that causes the bones to get weaker. This can make the bones weak and cause them to break more easily.  Blood pressure screening. Blood pressure changes and medicines to control blood pressure can make you feel dizzy.  Strength and balance checks. Your health care provider may recommend certain tests to check your  strength and balance while standing, walking, or changing positions.  Foot health exam. Foot pain and numbness, as well as not wearing proper footwear, can make you more likely to have a fall.  Depression screening. You may be more likely to have a fall if you have a fear of falling, feel emotionally low, or feel unable to do activities that you used to do.  Alcohol use screening. Using too much alcohol can affect your balance and may make you more likely to have a fall. What actions can I take to lower my risk of falls? General instructions  Talk with your health care provider about your risks for falling. Tell your health care provider if: ? You fall. Be sure to tell your health care provider about all falls, even ones that seem minor. ? You feel dizzy, sleepy, or off-balance.  Take over-the-counter and prescription medicines only as told by your health care provider. These include any supplements.  Eat a healthy diet and maintain a healthy weight. A healthy diet includes low-fat dairy products, low-fat (lean) meats, and fiber from whole grains, beans, and lots of fruits and vegetables. Home safety  Remove any tripping hazards, such as rugs, cords, and clutter.  Install safety equipment such as grab bars in bathrooms and safety rails on stairs.  Keep rooms and walkways well-lit. Activity  Follow a regular exercise program to stay fit. This will help you maintain your balance. Ask your health care provider what types of exercise are appropriate for you.  If you need a cane or walker,   use it as recommended by your health care provider.  Wear supportive shoes that have nonskid soles.   Lifestyle  Do not drink alcohol if your health care provider tells you not to drink.  If you drink alcohol, limit how much you have: ? 0-1 drink a day for women. ? 0-2 drinks a day for men.  Be aware of how much alcohol is in your drink. In the [place] one drink equals one typical bottle of beer (12  oz), one-half glass of wine (5 oz), or one shot of hard liquor (1 oz).  Do not use any products that contain nicotine or tobacco, such as cigarettes and e-cigarettes. If you need help quitting, ask your health care provider. Summary  Having a healthy lifestyle and getting preventive care can help to protect your health and wellness after age 72.  Screening and testing are the best way to find a health problem early and help you avoid having a fall. Early diagnosis and treatment give you the best chance for managing medical conditions that are more common for people who are older than age 72.  Falls are a major cause of broken bones and head injuries in people who are older than age 72. Take precautions to prevent a fall at home.  Work with your health care provider to learn what changes you can make to improve your health and wellness and to prevent falls. This information is not intended to replace advice given to you by your health care provider. Make sure you discuss any questions you have with your health care provider. Document Revised: 11/15/2018 Document Reviewed: 06/07/2017 Elsevier Patient Education  2021 Elsevier Inc. Hypertension, Adult Hypertension is another name for high blood pressure. High blood pressure forces your heart to work harder to pump blood. This can cause problems over time. There are two numbers in a blood pressure reading. There is a top number (systolic) over a bottom number (diastolic). It is best to have a blood pressure that is below 120/80. Healthy choices can help lower your blood pressure, or you may need medicine to help lower it. What are the causes? The cause of this condition is not known. Some conditions may be related to high blood pressure. What increases the risk?  Smoking.  Having type 2 diabetes mellitus, high cholesterol, or both.  Not getting enough exercise or physical activity.  Being overweight.  Having too much fat, sugar, calories,  or salt (sodium) in your diet.  Drinking too much alcohol.  Having long-term (chronic) kidney disease.  Having a family history of high blood pressure.  Age. Risk increases with age.  Race. You may be at higher risk if you are African American.  Gender. Men are at higher risk than women before age 45. After age 72, women are at higher risk than men.  Having obstructive sleep apnea.  Stress. What are the signs or symptoms?  High blood pressure may not cause symptoms. Very high blood pressure (hypertensive crisis) may cause: ? Headache. ? Feelings of worry or nervousness (anxiety). ? Shortness of breath. ? Nosebleed. ? A feeling of being sick to your stomach (nausea). ? Throwing up (vomiting). ? Changes in how you see. ? Very bad chest pain. ? Seizures. How is this treated?  This condition is treated by making healthy lifestyle changes, such as: ? Eating healthy foods. ? Exercising more. ? Drinking less alcohol.  Your health care provider may prescribe medicine if lifestyle changes are not enough to get your   blood pressure under control, and if: ? Your top number is above 130. ? Your bottom number is above 80.  Your personal target blood pressure may vary. Follow these instructions at home: Eating and drinking  If told, follow the DASH eating plan. To follow this plan: ? Fill one half of your plate at each meal with fruits and vegetables. ? Fill one fourth of your plate at each meal with whole grains. Whole grains include whole-wheat pasta, brown rice, and whole-grain bread. ? Eat or drink low-fat dairy products, such as skim milk or low-fat yogurt. ? Fill one fourth of your plate at each meal with low-fat (lean) proteins. Low-fat proteins include fish, chicken without skin, eggs, beans, and tofu. ? Avoid fatty meat, cured and processed meat, or chicken with skin. ? Avoid pre-made or processed food.  Eat less than 1,500 mg of salt each day.  Do not drink alcohol  if: ? Your doctor tells you not to drink. ? You are pregnant, may be pregnant, or are planning to become pregnant.  If you drink alcohol: ? Limit how much you use to:  0-1 drink a day for women.  0-2 drinks a day for men. ? Be aware of how much alcohol is in your drink. In the [place] one drink equals one 12 oz bottle of beer (355 mL), one 5 oz glass of wine (148 mL), or one 1 oz glass of hard liquor (44 mL).   Lifestyle  Work with your doctor to stay at a healthy weight or to lose weight. Ask your doctor what the best weight is for you.  Get at least 30 minutes of exercise most days of the week. This may include walking, swimming, or biking.  Get at least 30 minutes of exercise that strengthens your muscles (resistance exercise) at least 3 days a week. This may include lifting weights or doing Pilates.  Do not use any products that contain nicotine or tobacco, such as cigarettes, e-cigarettes, and chewing tobacco. If you need help quitting, ask your doctor.  Check your blood pressure at home as told by your doctor.  Keep all follow-up visits as told by your doctor. This is important.   Medicines  Take over-the-counter and prescription medicines only as told by your doctor. Follow directions carefully.  Do not skip doses of blood pressure medicine. The medicine does not work as well if you skip doses. Skipping doses also puts you at risk for problems.  Ask your doctor about side effects or reactions to medicines that you should watch for. Contact a doctor if you:  Think you are having a reaction to the medicine you are taking.  Have headaches that keep coming back (recurring).  Feel dizzy.  Have swelling in your ankles.  Have trouble with your vision. Get help right away if you:  Get a very bad headache.  Start to feel mixed up (confused).  Feel weak or numb.  Feel faint.  Have very bad pain in your: ? Chest. ? Belly (abdomen).  Throw up more than once.  Have  trouble breathing. Summary  Hypertension is another name for high blood pressure.  High blood pressure forces your heart to work harder to pump blood.  For most people, a normal blood pressure is less than 120/80.  Making healthy choices can help lower blood pressure. If your blood pressure does not get lower with healthy choices, you may need to take medicine. This information is not intended to replace advice given   to you by your health care provider. Make sure you discuss any questions you have with your health care provider. Document Revised: 04/04/2018 Document Reviewed: 04/04/2018 Elsevier Patient Education  2021 Elsevier Inc.  

## 2020-12-17 NOTE — Progress Notes (Signed)
Established Patient Office Visit  Subjective:  Patient ID: Amanda [name], female    DOB: 1951/07/27  Age: 70 y.o. MRN: [id number]  CC:  Chief Complaint  Patient presents with  . Hypertension    HPI [name] [facility] presents for follow up on hypertension and mood management.  HTN: Pt denies chest pain, palpitations, dizziness, orthopnea, or increased lower extremity swelling. Taking medication as directed without side effects. Checks BP at home and readings range <130/80. Pt follows a low salt diet. Tryies to stay hydrated.  GAD: Continues to see Dr. Felipa Furnace. Reports anxiety is under better control and overall feels better. Denies SI/HI or mood changes.  Tobacco use: Patient reports continues to smoke but has gradually reduced use. Smokes less when working and when more anxious or stressed she tends to crave smoking more.   Past Medical History:  Diagnosis Date  . Hypertension   . Jaw fracture [facility])     Past Surgical History:  Procedure Laterality Date  . BREAST SURGERY     "crystals"  . CESAREAN SECTION    . LEG SURGERY    . SPLENECTOMY      Family History  Problem Relation Age of Onset  . Heart attack Mother   . Hyperlipidemia Mother   . Hypertension Mother   . Heart disease Mother     Social History   Socioeconomic History  . Marital status: Divorced    Spouse name: Not on file  . Number of children: Not on file  . Years of education: Not on file  . Highest education level: Not on file  Occupational History  . Not on file  Tobacco Use  . Smoking status: Current Every Day Smoker    Packs/day: 0.25    Years: 10.00    Pack years: 2.50    Types: Cigarettes  . Smokeless tobacco: Never Used  Vaping Use  . Vaping Use: Never used  Substance and Sexual Activity  . Alcohol use: Yes    Alcohol/week: 1.0 standard drink    Types: 1 Glasses of wine per week  . Drug use: Never  . Sexual activity: Not Currently  Other Topics Concern  . Not on file   Social History Narrative  . Not on file   Social Determinants of Health   Financial Resource Strain: Not on file  Food Insecurity: Not on file  Transportation Needs: Not on file  Physical Activity: Not on file  Stress: Not on file  Social Connections: Not on file  Intimate Partner Violence: Not on file    Outpatient Medications Prior to Visit  Medication Sig Dispense Refill  . carvedilol (COREG) 3.125 MG tablet TAKE 1 TABLET BY MOUTH TWICE DAILY WITH A MEAL 120 tablet 0  . nicotine (NICODERM CQ - DOSED IN MG/24 HOURS) 14 mg/24hr patch Place 1 patch (14 mg total) onto the skin daily. 28 patch 0   No facility-administered medications prior to visit.    Allergies  Allergen Reactions  . Atorvastatin Nausea And Vomiting  . Penicillins Hives    ROS Review of Systems Review of Systems:  A fourteen system review of systems was performed and found to be positive as per HPI.   Objective:    Physical Exam General:  Well Developed, well nourished, appropriate for stated age.  Neuro:  Alert and oriented,  extra-ocular muscles intact  HEENT:  Normocephalic, atraumatic, neck supple, no carotid bruits appreciated  Skin:  no gross rash, warm, pink. Cardiac:  RRR, S1  S2 Respiratory:  ECTA B/L w/o wheezing, w/ decreased breath sounds, Not using accessory muscles, speaking in full sentences- unlabored. Vascular:  Ext warm, no cyanosis apprec.; cap RF less 2 sec. Trace edema  Psych:  No HI/SI, judgement and insight good, Euthymic mood. Full Affect.   BP 115/78   Pulse 68   Temp 99 F (37.2 C)   Ht 5\' 5"  (1.651 m)   Wt 161 lb (73 kg)   SpO2 96%   BMI 26.79 kg/m  Wt Readings from Last 3 Encounters:  12/17/20 161 lb (73 kg)  06/11/20 159 lb 6.4 oz (72.3 kg)  02/04/20 164 lb 3.2 oz (74.5 kg)     Health Maintenance Due  Topic Date Due  . Hepatitis C Screening  Never done  . DEXA SCAN  Never done  . PNA vac Low Risk Adult (1 of 2 - PCV13) Never done  . COVID-19 Vaccine (3 -  Booster for Pfizer series) 03/31/2020    There are no preventive care reminders to display for this patient.  Lab Results  Component Value Date   TSH 1.770 06/08/2020   Lab Results  Component Value Date   WBC 9.2 06/08/2020   HGB 13.9 06/08/2020   HCT 42.4 06/08/2020   MCV 99 (H) 06/08/2020   PLT 436 06/08/2020   Lab Results  Component Value Date   NA 141 06/08/2020   K 4.4 06/08/2020   CO2 25 06/08/2020   GLUCOSE 89 06/08/2020   BUN 15 06/08/2020   CREATININE 0.62 06/08/2020   BILITOT 0.4 06/08/2020   ALKPHOS 89 06/08/2020   AST 12 06/08/2020   ALT 13 06/08/2020   PROT 6.1 06/08/2020   ALBUMIN 3.9 06/08/2020   CALCIUM 9.4 06/08/2020   Lab Results  Component Value Date   CHOL 224 (H) 06/08/2020   Lab Results  Component Value Date   HDL 59 06/08/2020   Lab Results  Component Value Date   LDLCALC 138 (H) 06/08/2020   Lab Results  Component Value Date   TRIG 154 (H) 06/08/2020   Lab Results  Component Value Date   CHOLHDL 3.8 06/08/2020   Lab Results  Component Value Date   HGBA1C 5.7 (H) 06/08/2020      Assessment & Plan:   Problem List Items Addressed This Visit      Other   GAD (generalized anxiety disorder)    Other Visit Diagnoses    Essential hypertension    -  Primary   Encounter for tobacco use cessation counseling         HTN: - Controlled.  - Continue current medication regimen. - Will continue to monitor.  GAD: -Stable. -Continue to follow up with [facility], Dr. 13/08/2019. -Continue mindfulness therapy and relaxation techniques.  Encounter for tobacco use cessation counseling: -Smoking cessation instruction/counseling given:  counseled patient on the dangers of tobacco use, advised patient to stop smoking, and reviewed strategies to maximize success  -Encourage to continue with reducing use and eventually quit.     No orders of the defined types were placed in this encounter.   Follow-up: Return in about 6 months (around  06/19/2021) for MCW and FBW.   Note:  This note was prepared with assistance of Dragon voice recognition software. Occasional wrong-word or sound-a-like substitutions may have occurred due to the inherent limitations of voice recognition software.  13/07/2021, PA-C

## 2020-12-28 ENCOUNTER — Ambulatory Visit: Payer: Medicare Other | Admitting: Psychology

## 2021-01-11 ENCOUNTER — Other Ambulatory Visit: Payer: Self-pay | Admitting: Physician Assistant

## 2021-01-11 ENCOUNTER — Ambulatory Visit (INDEPENDENT_AMBULATORY_CARE_PROVIDER_SITE_OTHER): Payer: Medicare Other | Admitting: Psychology

## 2021-01-11 DIAGNOSIS — F411 Generalized anxiety disorder: Secondary | ICD-10-CM

## 2021-01-11 DIAGNOSIS — Z1231 Encounter for screening mammogram for malignant neoplasm of breast: Secondary | ICD-10-CM

## 2021-01-13 ENCOUNTER — Ambulatory Visit: Payer: Medicare Other | Admitting: Physician Assistant

## 2021-01-25 ENCOUNTER — Ambulatory Visit (INDEPENDENT_AMBULATORY_CARE_PROVIDER_SITE_OTHER): Payer: Medicare Other | Admitting: Psychology

## 2021-01-25 ENCOUNTER — Other Ambulatory Visit: Payer: Self-pay | Admitting: Physician Assistant

## 2021-01-25 DIAGNOSIS — I10 Essential (primary) hypertension: Secondary | ICD-10-CM

## 2021-01-25 DIAGNOSIS — F411 Generalized anxiety disorder: Secondary | ICD-10-CM | POA: Diagnosis not present

## 2021-02-22 ENCOUNTER — Ambulatory Visit (INDEPENDENT_AMBULATORY_CARE_PROVIDER_SITE_OTHER): Payer: Medicare Other | Admitting: Psychology

## 2021-02-22 DIAGNOSIS — F411 Generalized anxiety disorder: Secondary | ICD-10-CM

## 2021-03-08 ENCOUNTER — Ambulatory Visit (INDEPENDENT_AMBULATORY_CARE_PROVIDER_SITE_OTHER): Payer: Medicare Other | Admitting: Psychology

## 2021-03-08 ENCOUNTER — Ambulatory Visit: Payer: Medicare Other

## 2021-03-08 DIAGNOSIS — F411 Generalized anxiety disorder: Secondary | ICD-10-CM

## 2021-03-22 ENCOUNTER — Ambulatory Visit (INDEPENDENT_AMBULATORY_CARE_PROVIDER_SITE_OTHER): Payer: Medicare Other | Admitting: Psychology

## 2021-03-22 DIAGNOSIS — F411 Generalized anxiety disorder: Secondary | ICD-10-CM | POA: Diagnosis not present

## 2021-04-05 ENCOUNTER — Ambulatory Visit (INDEPENDENT_AMBULATORY_CARE_PROVIDER_SITE_OTHER): Payer: Medicare Other | Admitting: Psychology

## 2021-04-05 DIAGNOSIS — F411 Generalized anxiety disorder: Secondary | ICD-10-CM | POA: Diagnosis not present

## 2021-04-19 ENCOUNTER — Ambulatory Visit (INDEPENDENT_AMBULATORY_CARE_PROVIDER_SITE_OTHER): Payer: Medicare Other | Admitting: Psychology

## 2021-04-19 DIAGNOSIS — F411 Generalized anxiety disorder: Secondary | ICD-10-CM

## 2021-04-27 ENCOUNTER — Ambulatory Visit
Admission: RE | Admit: 2021-04-27 | Discharge: 2021-04-27 | Disposition: A | Discharge status: Home / Self Care | Payer: Medicare Other | Source: Ambulatory Visit | Attending: Physician Assistant | Admitting: Physician Assistant

## 2021-04-27 ENCOUNTER — Other Ambulatory Visit: Payer: Self-pay

## 2021-04-27 DIAGNOSIS — Z1231 Encounter for screening mammogram for malignant neoplasm of breast: Secondary | ICD-10-CM | POA: Diagnosis not present

## 2021-05-03 ENCOUNTER — Ambulatory Visit (INDEPENDENT_AMBULATORY_CARE_PROVIDER_SITE_OTHER): Payer: Medicare Other | Admitting: Psychology

## 2021-05-03 DIAGNOSIS — F411 Generalized anxiety disorder: Secondary | ICD-10-CM

## 2021-05-06 ENCOUNTER — Other Ambulatory Visit: Payer: Self-pay | Admitting: Physician Assistant

## 2021-05-06 DIAGNOSIS — I10 Essential (primary) hypertension: Secondary | ICD-10-CM

## 2021-05-17 ENCOUNTER — Ambulatory Visit: Payer: Medicare Other | Admitting: Psychology

## 2021-05-24 ENCOUNTER — Ambulatory Visit: Payer: Medicare Other | Admitting: Psychology

## 2021-05-31 ENCOUNTER — Ambulatory Visit: Payer: Medicare Other | Admitting: Psychology

## 2021-06-07 ENCOUNTER — Ambulatory Visit (INDEPENDENT_AMBULATORY_CARE_PROVIDER_SITE_OTHER): Payer: Medicare Other | Admitting: Psychology

## 2021-06-07 DIAGNOSIS — F411 Generalized anxiety disorder: Secondary | ICD-10-CM | POA: Diagnosis not present

## 2021-06-21 ENCOUNTER — Ambulatory Visit: Payer: Medicare Other | Admitting: Psychology

## 2021-06-22 ENCOUNTER — Encounter: Payer: Self-pay | Admitting: Physician Assistant

## 2021-06-22 ENCOUNTER — Ambulatory Visit (INDEPENDENT_AMBULATORY_CARE_PROVIDER_SITE_OTHER): Payer: Medicare Other | Admitting: Physician Assistant

## 2021-06-22 ENCOUNTER — Other Ambulatory Visit: Payer: Self-pay

## 2021-06-22 VITALS — BP 109/73 | HR 66 | Temp 98.0°F | Ht 65.0 in | Wt 161.0 lb

## 2021-06-22 DIAGNOSIS — I10 Essential (primary) hypertension: Secondary | ICD-10-CM

## 2021-06-22 DIAGNOSIS — Z0000 Encounter for general adult medical examination without abnormal findings: Secondary | ICD-10-CM

## 2021-06-22 MED ORDER — CARVEDILOL 3.125 MG PO TABS: 3.1250 mg | ORAL_TABLET | Freq: Two times a day (BID) | ORAL | 1 refills | Status: DC

## 2021-06-22 NOTE — Progress Notes (Deleted)
Established Patient Office Visit  Subjective:  Patient ID: Amanda [name], female    DOB: 1951-06-16  Age: 70 y.o. MRN: [id number]  CC: No chief complaint on file.   HPI Amanda [name] presents for follow up on hypertension  Past Medical History:  Diagnosis Date   Hypertension    Jaw fracture [facility])     Past Surgical History:  Procedure Laterality Date   BREAST SURGERY     "crystals"   CESAREAN SECTION     LEG SURGERY     SPLENECTOMY      Family History  Problem Relation Age of Onset   Heart attack Mother    Hyperlipidemia Mother    Hypertension Mother    Heart disease Mother     Social History   Socioeconomic History   Marital status: Divorced    Spouse name: Not on file   Number of children: Not on file   Years of education: Not on file   Highest education level: Not on file  Occupational History   Not on file  Tobacco Use   Smoking status: Every Day    Packs/day: 0.25    Years: 10.00    Pack years: 2.50    Types: Cigarettes   Smokeless tobacco: Never  Vaping Use   Vaping Use: Never used  Substance and Sexual Activity   Alcohol use: Yes    Alcohol/week: 1.0 standard drink    Types: 1 Glasses of wine per week   Drug use: Never   Sexual activity: Not Currently  Other Topics Concern   Not on file  Social History Narrative   Not on file   Social Determinants of Health   Financial Resource Strain: Not on file  Food Insecurity: Not on file  Transportation Needs: Not on file  Physical Activity: Not on file  Stress: Not on file  Social Connections: Not on file  Intimate Partner Violence: Not on file    Outpatient Medications Prior to Visit  Medication Sig Dispense Refill   carvedilol (COREG) 3.125 MG tablet TAKE 1 TABLET BY MOUTH TWICE DAILY WITH A MEAL 180 tablet 0   nicotine (NICODERM CQ - DOSED IN MG/24 HOURS) 14 mg/24hr patch Place 1 patch (14 mg total) onto the skin daily. 28 patch 0   No facility-administered medications  prior to visit.    Allergies  Allergen Reactions   Atorvastatin Nausea And Vomiting   Penicillins Hives    ROS Review of Systems    Objective:    Physical Exam  There were no vitals taken for this visit. Wt Readings from Last 3 Encounters:  12/17/20 161 lb (73 kg)  06/11/20 159 lb 6.4 oz (72.3 kg)  02/04/20 164 lb 3.2 oz (74.5 kg)     Health Maintenance Due  Topic Date Due   Pneumonia Vaccine 40+ Years old (1 - PCV) Never done   Hepatitis C Screening  Never done   Zoster Vaccines- Shingrix (1 of 2) Never done   DEXA SCAN  Never done   COVID-19 Vaccine (3 - Pfizer risk series) 11/27/2019   INFLUENZA VACCINE  Never done    There are no preventive care reminders to display for this patient.  Lab Results  Component Value Date   TSH 1.770 06/08/2020   Lab Results  Component Value Date   WBC 9.2 06/08/2020   HGB 13.9 06/08/2020   HCT 42.4 06/08/2020   MCV 99 (H) 06/08/2020   PLT 436 06/08/2020   Lab  Results  Component Value Date   NA 141 06/08/2020   K 4.4 06/08/2020   CO2 25 06/08/2020   GLUCOSE 89 06/08/2020   BUN 15 06/08/2020   CREATININE 0.62 06/08/2020   BILITOT 0.4 06/08/2020   ALKPHOS 89 06/08/2020   AST 12 06/08/2020   ALT 13 06/08/2020   PROT 6.1 06/08/2020   ALBUMIN 3.9 06/08/2020   CALCIUM 9.4 06/08/2020   Lab Results  Component Value Date   CHOL 224 (H) 06/08/2020   Lab Results  Component Value Date   HDL 59 06/08/2020   Lab Results  Component Value Date   LDLCALC 138 (H) 06/08/2020   Lab Results  Component Value Date   TRIG 154 (H) 06/08/2020   Lab Results  Component Value Date   CHOLHDL 3.8 06/08/2020   Lab Results  Component Value Date   HGBA1C 5.7 (H) 06/08/2020      Assessment & Plan:   Problem List Items Addressed This Visit   None   No orders of the defined types were placed in this encounter.   Follow-up: No follow-ups on file.    Mayer Masker, PA-C

## 2021-06-22 NOTE — Progress Notes (Signed)
Subjective:   Amanda [name] is a 70 y.o. female who presents for Medicare Annual (Subsequent) preventive examination.  Review of Systems    General:   No F/C, wt loss Pulm:   No DIB, SOB, pleuritic chest pain Card:  No CP, palpitations Abd:  No n/v/d or pain Ext:  No inc edema from baseline    Objective:    Today's Vitals   06/22/21 1039  BP: 109/73  Pulse: 66  Temp: 98 F (36.7 C)  SpO2: 97%  Weight: 161 lb (73 kg)  Height: 5\' 5"  (1.651 m)   Body mass index is 26.79 kg/m.  Advanced Directives 07/26/2018  Does Patient Have a Medical Advance Directive? No  Would patient like information on creating a medical advance directive? No - Patient declined    Current Medications (verified) Outpatient Encounter Medications as of 06/22/2021  Medication Sig   nicotine (NICODERM CQ - DOSED IN MG/24 HOURS) 14 mg/24hr patch Place 1 patch (14 mg total) onto the skin daily.   [DISCONTINUED] carvedilol (COREG) 3.125 MG tablet TAKE 1 TABLET BY MOUTH TWICE DAILY WITH A MEAL   carvedilol (COREG) 3.125 MG tablet Take 1 tablet (3.125 mg total) by mouth 2 (two) times daily with a meal.   No facility-administered encounter medications on file as of 06/22/2021.    Allergies (verified) Atorvastatin and Penicillins   History: Past Medical History:  Diagnosis Date   Hypertension    Jaw fracture (HCC)    Past Surgical History:  Procedure Laterality Date   BREAST SURGERY     "crystals"   CESAREAN SECTION     LEG SURGERY     SPLENECTOMY     Family History  Problem Relation Age of Onset   Heart attack Mother    Hyperlipidemia Mother    Hypertension Mother    Heart disease Mother    Social History   Socioeconomic History   Marital status: Divorced    Spouse name: Not on file   Number of children: Not on file   Years of education: Not on file   Highest education level: Not on file  Occupational History   Not on file  Tobacco Use   Smoking status: Every Day     Packs/day: 0.25    Years: 10.00    Pack years: 2.50    Types: Cigarettes   Smokeless tobacco: Never  Vaping Use   Vaping Use: Never used  Substance and Sexual Activity   Alcohol use: Yes    Alcohol/week: 1.0 standard drink    Types: 1 Glasses of wine per week   Drug use: Never   Sexual activity: Not Currently  Other Topics Concern   Not on file  Social History Narrative   Not on file   Social Determinants of Health   Financial Resource Strain: Not on file  Food Insecurity: Not on file  Transportation Needs: Not on file  Physical Activity: Not on file  Stress: Not on file  Social Connections: Not on file    Tobacco Counseling Ready to quit: Not Answered Counseling given: Not Answered   Diabetic? no   Activities of Daily Living In your present state of health, do you have any difficulty performing the following activities: 06/22/2021 12/17/2020  Hearing? N N  Vision? N N  Difficulty concentrating or making decisions? N N  Walking or climbing stairs? N N  Dressing or bathing? N N  Doing errands, shopping? N N  Some recent data might be hidden  Patient Care Team: Lorrene Reid, PA-C as PCP - General (Physician Assistant)  Indicate any recent Medical Services you may have received from other than [facility] providers in the past year (date may be approximate).     Assessment:   This is a routine wellness examination for Amanda [name].  Hearing/Vision screen No results found.  Dietary issues and exercise activities discussed: -Heart healthy diet low in saturated and trans fats. Recommend to continue with mindfulness therapy and yoga.   Goals Addressed   None   Depression Screen PHQ 2/9 Scores 06/22/2021 12/17/2020 06/11/2020 02/04/2020 09/18/2019 07/26/2018  PHQ - 2 Score 1 0 0 1 0 0  PHQ- 9 Score 4 0 1 2 0 1    Fall Risk Fall Risk  06/22/2021 12/17/2020 06/11/2020 02/04/2020 07/26/2018  Falls in the past year? 0 0 1 0 0  Number falls in past yr: 0 0 0 - -  Injury  with Fall? 0 0 1 - -  Risk for fall due to : No Fall Risks No Fall Risks History of fall(s) - -  Follow up Falls evaluation completed Falls evaluation completed Falls evaluation completed Falls evaluation completed Falls evaluation completed    [place]:  Any stairs in or around the home? Yes  If so, are there any without handrails? No  Home free of loose throw rugs in walkways, pet beds, electrical cords, etc? Yes  Adequate lighting in your home to reduce risk of falls? Yes   ASSISTIVE DEVICES UTILIZED TO PREVENT FALLS:  Life alert? No  Use of a cane, walker or w/c? No  Grab bars in the bathroom? No  Shower chair or bench in shower? No  Elevated toilet seat or a handicapped toilet? No   TIMED UP AND GO:  Was the test performed? Yes .  Length of time to ambulate 10 feet: 15 sec.   Gait steady and fast without use of assistive device  Cognitive Function:   6CIT Screen 06/22/2021 06/11/2020  What Year? 0 points 0 points  What month? 0 points 0 points  What time? 0 points 0 points  Count back from 20 0 points 0 points  Months in reverse 0 points 0 points  Repeat phrase 8 points 2 points  Total Score 8 2    Immunizations Immunization History  Administered Date(s) Administered   PFIZER(Purple Top)SARS-COV-2 Vaccination 10/06/2019, 10/30/2019   Tdap 08/09/2011    TDAP status: Up to date  Flu Vaccine status: Declined, Education has been provided regarding the importance of this vaccine but patient still declined. Advised may receive this vaccine at local pharmacy or Health Dept. Aware to provide a copy of the vaccination record if obtained from local pharmacy or Health Dept. Verbalized acceptance and understanding.  Pneumococcal vaccine status: Declined,  Education has been provided regarding the importance of this vaccine but patient still declined. Advised may receive this vaccine at local pharmacy or Health Dept. Aware to provide a copy  of the vaccination record if obtained from local pharmacy or Health Dept. Verbalized acceptance and understanding.   Covid-19 vaccine status: Completed vaccines  Qualifies for Shingles Vaccine? Yes   Zostavax completed No   Shingrix Completed?: No.    Education has been provided regarding the importance of this vaccine. Patient has been advised to call insurance company to determine out of pocket expense if they have not yet received this vaccine. Advised may also receive vaccine at local pharmacy or Health Dept. Verbalized acceptance and  understanding.  Screening Tests Health Maintenance  Topic Date Due   Pneumonia Vaccine 66+ Years old (1 - PCV) Never done   Hepatitis C Screening  Never done   Zoster Vaccines- Shingrix (1 of 2) Never done   DEXA SCAN  Never done   COVID-19 Vaccine (3 - Pfizer risk series) 11/27/2019   INFLUENZA VACCINE  Never done   TETANUS/TDAP  08/08/2021   Fecal DNA (Cologuard)  10/23/2022   MAMMOGRAM  04/28/2023   HPV VACCINES  Aged Out    Health Maintenance  Health Maintenance Due  Topic Date Due   Pneumonia Vaccine 76+ Years old (1 - PCV) Never done   Hepatitis C Screening  Never done   Zoster Vaccines- Shingrix (1 of 2) Never done   DEXA SCAN  Never done   COVID-19 Vaccine (3 - Pfizer risk series) 11/27/2019   INFLUENZA VACCINE  Never done    Colorectal cancer screening: Type of screening: Cologuard. Completed 10/23/2019. Repeat every 3 years  Mammogram status: Completed 05/03/2021. Repeat every year  Patient declined bone density.   Lung Cancer Screening: (Low Dose CT Chest recommended if Age 91-80 years, 30 pack-year currently smoking OR have quit w/in 15years.) does not qualify.   Lung Cancer Screening Referral: n/a  Additional Screening:  Hepatitis C Screening: does qualify; Completed pt declined   Vision Screening: Recommended annual ophthalmology exams for early detection of glaucoma and other disorders of the eye. Is the patient up to  date with their annual eye exam?  No  Who is the provider or what is the name of the office in which the patient attends annual eye exams? If pt is not established with a provider, would they like to be referred to a provider to establish care?  Plans to get established with local ophthalmologist .   Dental Screening: Recommended annual dental exams for proper oral hygiene  Community Resource Referral / Chronic Care Management: CRR required this visit?  No   CCM required this visit?  No      Plan:  -Recommend to schedule lab visit for fasting blood-work.  -Continue current medication regimen. Provided refill. -BP controlled. -Continue with BH therapy. -Discussed 6CIT screening, patient reports family hx of dementia, will continue to monitor. -Encourage to continue with tobacco reduction and eventually quit. -Follow-up in 6 months for HTN, mood  I have personally reviewed and noted the following in the patient's chart:   Medical and social history Use of alcohol, tobacco or illicit drugs  Current medications and supplements including opioid prescriptions.  Functional ability and status Nutritional status Physical activity Advanced directives List of other physicians Hospitalizations, surgeries, and ER visits in previous 12 months Vitals Screenings to include cognitive, depression, and falls Referrals and appointments  In addition, I have reviewed and discussed with patient certain preventive protocols, quality metrics, and best practice recommendations. A written personalized care plan for preventive services as well as general preventive health recommendations were provided to patient.     Lorrene Reid, PA-C   06/22/2021

## 2021-06-22 NOTE — Patient Instructions (Signed)
Preventive Care 40 Years and Older, Female Preventive care refers to lifestyle choices and visits with your health care provider that can promote health and wellness. Preventive care visits are also called wellness exams. What can I expect for my preventive care visit? Counseling Your health care provider may ask you questions about your: Medical history, including: Past medical problems. Family medical history. Pregnancy and menstrual history. History of falls. Current health, including: Memory and ability to understand (cognition). Emotional well-being. Home life and relationship well-being. Sexual activity and sexual health. Lifestyle, including: Alcohol, nicotine or tobacco, and drug use. Access to firearms. Diet, exercise, and sleep habits. Work and work Statistician. Sunscreen use. Safety issues such as seatbelt and bike helmet use. Physical exam Your health care provider will check your: Height and weight. These may be used to calculate your BMI (body mass index). BMI is a measurement that tells if you are at a healthy weight. Waist circumference. This measures the distance around your waistline. This measurement also tells if you are at a healthy weight and may help predict your risk of certain diseases, such as type 2 diabetes and high blood pressure. Heart rate and blood pressure. Body temperature. Skin for abnormal spots. What immunizations do I need? Vaccines are usually given at various ages, according to a schedule. Your health care provider will recommend vaccines for you based on your age, medical history, and lifestyle or other factors, such as travel or where you work. What tests do I need? Screening Your health care provider may recommend screening tests for certain conditions. This may include: Lipid and cholesterol levels. Hepatitis C test. Hepatitis B test. HIV (human immunodeficiency virus) test. STI (sexually transmitted infection) testing, if you are at  risk. Lung cancer screening. Colorectal cancer screening. Diabetes screening. This is done by checking your blood sugar (glucose) after you have not eaten for a while (fasting). Mammogram. Talk with your health care provider about how often you should have regular mammograms. BRCA-related cancer screening. This may be done if you have a family history of breast, ovarian, tubal, or peritoneal cancers. Bone density scan. This is done to screen for osteoporosis. Talk with your health care provider about your test results, treatment options, and if necessary, the need for more tests. Follow these instructions at home: Eating and drinking  Eat a diet that includes fresh fruits and vegetables, whole grains, lean protein, and low-fat dairy products. Limit your intake of foods with high amounts of sugar, saturated fats, and salt. Take vitamin and mineral supplements as recommended by your health care provider. Do not drink alcohol if your health care provider tells you not to drink. If you drink alcohol: Limit how much you have to 0-1 drink a day. Know how much alcohol is in your drink. In the [place] one drink equals one 12 oz bottle of beer (355 mL), one 5 oz glass of wine (148 mL), or one 1 oz glass of hard liquor (44 mL). Lifestyle Brush your teeth every morning and night with fluoride toothpaste. Floss one time each day. Exercise for at least 30 minutes 5 or more days each week. Do not use any products that contain nicotine or tobacco. These products include cigarettes, chewing tobacco, and vaping devices, such as e-cigarettes. If you need help quitting, ask your health care provider. Do not use drugs. If you are sexually active, practice safe sex. Use a condom or other form of protection in order to prevent STIs. Take aspirin only as told by your  health care provider. Make sure that you understand how much to take and what form to take. Work with your health care provider to find out whether it  is safe and beneficial for you to take aspirin daily. Ask your health care provider if you need to take a cholesterol-lowering medicine (statin). Find healthy ways to manage stress, such as: Meditation, yoga, or listening to music. Journaling. Talking to a trusted person. Spending time with friends and family. Minimize exposure to UV radiation to reduce your risk of skin cancer. Safety Always wear your seat belt while driving or riding in a vehicle. Do not drive: If you have been drinking alcohol. Do not ride with someone who has been drinking. When you are tired or distracted. While texting. If you have been using any mind-altering substances or drugs. Wear a helmet and other protective equipment during sports activities. If you have firearms in your house, make sure you follow all gun safety procedures. What's next? Visit your health care provider once a year for an annual wellness visit. Ask your health care provider how often you should have your eyes and teeth checked. Stay up to date on all vaccines. This information is not intended to replace advice given to you by your health care provider. Make sure you discuss any questions you have with your health care provider. Document Revised: 01/20/2021 Document Reviewed: 01/20/2021 Elsevier Patient Education  [place].

## 2021-07-05 ENCOUNTER — Ambulatory Visit (INDEPENDENT_AMBULATORY_CARE_PROVIDER_SITE_OTHER): Payer: Medicare Other | Admitting: Psychology

## 2021-07-05 DIAGNOSIS — F411 Generalized anxiety disorder: Secondary | ICD-10-CM | POA: Diagnosis not present

## 2021-07-06 ENCOUNTER — Other Ambulatory Visit: Payer: Medicare Other

## 2021-07-06 ENCOUNTER — Other Ambulatory Visit: Payer: Self-pay

## 2021-07-06 DIAGNOSIS — E7800 Pure hypercholesterolemia, unspecified: Secondary | ICD-10-CM

## 2021-07-06 DIAGNOSIS — I10 Essential (primary) hypertension: Secondary | ICD-10-CM

## 2021-07-06 DIAGNOSIS — Z0000 Encounter for general adult medical examination without abnormal findings: Secondary | ICD-10-CM

## 2021-07-06 DIAGNOSIS — F411 Generalized anxiety disorder: Secondary | ICD-10-CM

## 2021-07-07 LAB — LIPID PANEL
Chol/HDL Ratio: 3.2 ratio (ref 0.0–4.4)
Cholesterol, Total: 193 mg/dL (ref 100–199)
HDL: 60 mg/dL (ref >39)
LDL Chol Calc (NIH): 115 mg/dL — ABNORMAL HIGH (ref 0–99)
Triglycerides: 102 mg/dL (ref 0–149)
VLDL Cholesterol Cal: 18 mg/dL (ref 5–40)

## 2021-07-07 LAB — COMPREHENSIVE METABOLIC PANEL
ALT: 13 IU/L (ref 0–32)
AST: 13 IU/L (ref 0–40)
Albumin/Globulin Ratio: 1.6 (ref 1.2–2.2)
Albumin: 3.4 g/dL — ABNORMAL LOW (ref 3.7–4.7)
Alkaline Phosphatase: 93 IU/L (ref 44–121)
BUN/Creatinine Ratio: 28 (ref 12–28)
BUN: 16 mg/dL (ref 8–27)
Bilirubin Total: 0.3 mg/dL (ref 0.0–1.2)
CO2: 26 mmol/L (ref 20–29)
Calcium: 8.7 mg/dL (ref 8.7–10.3)
Chloride: 107 mmol/L — ABNORMAL HIGH (ref 96–106)
Creatinine, Ser: 0.58 mg/dL (ref 0.57–1.00)
Globulin, Total: 2.1 g/dL (ref 1.5–4.5)
Glucose: 87 mg/dL (ref 70–99)
Potassium: 4.2 mmol/L (ref 3.5–5.2)
Sodium: 146 mmol/L — ABNORMAL HIGH (ref 134–144)
Total Protein: 5.5 g/dL — ABNORMAL LOW (ref 6.0–8.5)
eGFR: 96 mL/min/{1.73_m2} (ref >59)

## 2021-07-07 LAB — CBC
Hematocrit: 39.7 % (ref 34.0–46.6)
Hemoglobin: 13.1 g/dL (ref 11.1–15.9)
MCH: 32.3 pg (ref 26.6–33.0)
MCHC: 33 g/dL (ref 31.5–35.7)
MCV: 98 fL — ABNORMAL HIGH (ref 79–97)
Platelets: 359 10*3/uL (ref 150–450)
RBC: 4.05 x10E6/uL (ref 3.77–5.28)
RDW: 11.7 % (ref 11.7–15.4)
WBC: 7.5 10*3/uL (ref 3.4–10.8)

## 2021-07-07 LAB — TSH: TSH: 2.02 u[IU]/mL (ref 0.450–4.500)

## 2021-07-07 LAB — HEMOGLOBIN A1C
Est. average glucose Bld gHb Est-mCnc: 123 mg/dL
Hgb A1c MFr Bld: 5.9 % — ABNORMAL HIGH (ref 4.8–5.6)

## 2021-07-19 ENCOUNTER — Ambulatory Visit: Payer: Medicare Other | Admitting: Psychology

## 2021-08-16 ENCOUNTER — Ambulatory Visit: Payer: Medicare Other | Admitting: Psychology

## 2021-09-06 ENCOUNTER — Ambulatory Visit (INDEPENDENT_AMBULATORY_CARE_PROVIDER_SITE_OTHER): Payer: Medicare Other | Admitting: Psychology

## 2021-09-06 DIAGNOSIS — F411 Generalized anxiety disorder: Secondary | ICD-10-CM | POA: Diagnosis not present

## 2021-09-06 NOTE — Progress Notes (Signed)
Progress Note:  Start: 09/06/2021 10:00 AM End: 09/06/2021 10:55 AM Diagnosis F41.1 (Generalized anxiety disorder) Symptoms A family that is not a stable source of positive influence or support, since family members have little or no contact with each other. (Status: maintained) -- No Description Entered  Autonomic hyperactivity (e.g., palpitations, shortness of breath, dry mouth, trouble swallowing, nausea, diarrhea). (Status: maintained) -- No Description Entered  Constant or frequent conflict with parents and/or siblings. (Status: maintained) -- No Description Entered  Decrease or loss of appetite. (Status: maintained) -- No Description Entered  Excessive and/or unrealistic worry that is difficult to control occurring more days than not for at least 6 months about a number of events or activities. (Status: maintained) -- No Description Entered  Feelings of hopelessness, worthlessness, or inappropriate guilt. (Status: maintained) -- No Description Entered  Hypervigilance (e.g., feeling constantly on edge, experiencing concentration difficulties, having trouble falling or staying asleep, exhibiting a general state of irritability). (Status: maintained) -- No Description Entered  Irritable mood. (Status: maintained) -- No Description Entered  Low self-esteem. (Status: maintained) -- No Description Entered  Motor tension (e.g., restlessness, tiredness, shakiness, muscle tension). (Status: maintained) -- No Description Entered  Psychomotor agitation or retardation. (Status: maintained) -- No Description Entered  Sleeplessness or hypersomnia. (Status: maintained) -- No Description Entered  Medication Status compliance  Safety none  If Suicidal or Homicidal State Action Taken: unspecified  Current Risk: low Medications unspecified Objectives Related Problem: Learn and implement coping skills that result in a reduction of anxiety and worry, and improved daily functioning. Description:  Reestablish a consistent sleep-wake cycle. Target Date: 2022-03-08 Frequency: Biweekly Modality: individual Progress: 80%  Related Problem: Learn and implement coping skills that result in a reduction of anxiety and worry, and improved daily functioning. Description: Cooperate with a medication evaluation by a physician. Target Date: 2022-03-08 Frequency: Biweekly Modality: individual Progress: 100%  Related Problem: Learn and implement coping skills that result in a reduction of anxiety and worry, and improved daily functioning. Description: Learn and implement calming skills to reduce overall anxiety and manage anxiety symptoms. Target Date: 2022-03-08 Frequency: Biweekly Modality: individual Progress: 60%  Related Problem: Learn and implement coping skills that result in a reduction of anxiety and worry, and improved daily functioning. Description: Identify, challenge, and replace biased, fearful self-talk with positive, realistic, and empowering self-talk. Target Date: 2022-03-08 Frequency: Biweekly Modality: individual Progress: 50%  Planned Intervention: Assign the client a homework exercise in which he/she identifies fearful self-talk, identifies biases in the self-talk, generates alternatives, and tests through behavioral experiments (or assign "Negative Thoughts Trigger Negative Feelings" in the Adult Psychotherapy Homework Planner by [facility]); review and reinforce success, providing corrective feedback toward improvement.  Related Problem: Learn and implement coping skills that result in a reduction of anxiety and worry, and improved daily functioning. Description: Learn and implement personal and interpersonal skills to reduce anxiety and improve interpersonal relationships. Target Date: 2022-03-08 Frequency: Biweekly Modality: individual Progress: 30%  Planned Intervention: Assign the client a homework exercise in which he/she implements communication skills training  into his/her daily life (or assign "Restoring Socialization Comfort" in the Adult Psychotherapy Homework Planner by [facility]); review, reinforce success, and provide corrective feedback toward improvement.  Related Problem: Learn and implement coping skills that result in a reduction of anxiety and worry, and improved daily functioning. Description: Identify the major life conflicts from the past and present that form the basis for present anxiety. Target Date: 2022-03-08 Frequency: Biweekly Modality: individual Progress: 40%  Related  Problem: Reach a level of reduced tension, increased satisfaction, and improved communication with family and/or other authority figures. Description: Describe the conflicts and the causes of conflicts between self and parents. Target Date: 2022-03-08 Frequency: Biweekly Modality: individual Progress: 40%  Related Problem: Reach a level of reduced tension, increased satisfaction, and improved communication with family and/or other authority figures. Description: Identify own as well as others' role in the family conflicts. Target Date: 2022-03-08 Frequency: Biweekly Modality: individual Progress: 30%  Client Response full compliance  Service Location Location, [address] [place], [place] [address]  Service Code cpt [phone]) Mindfulness training  Facilitate problem solving  Clarify interpersonal incident (IPT)  Validate/empathize  Identify/label emotions  Rationally challenge thoughts or beliefs/cognitive restructuring  Identify automatic thoughts  Normalize/Reframe Make connections between past and present  Comments  Today I met with Elza Rafter in remote video (WebEx) face-to-face in individual psychotherapy l as an accommodation to the [place] pandemic.   Distance Site: Client's Home  Originating Site: Dr Jannifer Franklin Remote Office  Consent: Obtained verbal consent to transmit session remotely.   Anxiety Rating: 2-4   Kasha  reports that she is not longer working at [employer].  She shared the numerous issues that arose, her disappointment and her hurt feelings.  We d/e/p where things went wrong, why she failed to listen to the warning signs,  and proceed to "make excuses" for her employer.  We acknowledged that this is now a pattern and that we need to continue to p/ in order to help her understand why this dynamic repeats itself.   Progress: patient is maintaining at a low level of anxiety and aeb in less frequent anxiety attacks, a decreased unproductive negative thoughts, feelings of inappropriate guilt, overwhelming feelings. Nevertheless, she has been consistent in adding movement to her days and in completing an evening meditation practice most days.    [name], PhD

## 2021-09-20 ENCOUNTER — Ambulatory Visit (INDEPENDENT_AMBULATORY_CARE_PROVIDER_SITE_OTHER): Payer: Medicare Other | Admitting: Psychology

## 2021-09-20 DIAGNOSIS — F411 Generalized anxiety disorder: Secondary | ICD-10-CM | POA: Diagnosis not present
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
## 2021-10-04 ENCOUNTER — Ambulatory Visit (INDEPENDENT_AMBULATORY_CARE_PROVIDER_SITE_OTHER): Payer: Medicare Other | Admitting: Psychology

## 2021-10-04 DIAGNOSIS — F411 Generalized anxiety disorder: Secondary | ICD-10-CM | POA: Diagnosis not present

## 2021-10-04 NOTE — Progress Notes (Signed)
Progress Note:  Start: 10/04/2021 10:00 AM End: 10/04/2021 10:55 AM Diagnosis F41.1 (Generalized anxiety disorder) Symptoms A family that is not a stable source of positive influence or support, since family members have little or no contact with each other. (Status: maintained) -- No Description Entered  Autonomic hyperactivity (e.g., palpitations, shortness of breath, dry mouth, trouble swallowing, nausea, diarrhea). (Status: maintained) -- No Description Entered  Constant or frequent conflict with parents and/or siblings. (Status: maintained) -- No Description Entered  Decrease or loss of appetite. (Status: maintained) -- No Description Entered  Excessive and/or unrealistic worry that is difficult to control occurring more days than not for at least 6 months about a number of events or activities. (Status: maintained) -- No Description Entered  Feelings of hopelessness, worthlessness, or inappropriate guilt. (Status: maintained) -- No Description Entered  Hypervigilance (e.g., feeling constantly on edge, experiencing concentration difficulties, having trouble falling or staying asleep, exhibiting a general state of irritability). (Status: maintained) -- No Description Entered  Irritable mood. (Status: maintained) -- No Description Entered  Low self-esteem. (Status: maintained) -- No Description Entered  Motor tension (e.g., restlessness, tiredness, shakiness, muscle tension). (Status: maintained) -- No Description Entered  Psychomotor agitation or retardation. (Status: maintained) -- No Description Entered  Sleeplessness or hypersomnia. (Status: maintained) -- No Description Entered  Medication Status compliance  Safety none  If Suicidal or Homicidal State Action Taken: unspecified  Current Risk: low Medications unspecified Objectives Related Problem: Learn and implement coping skills that result in a reduction of anxiety and worry, and improved daily functioning. Description:  Reestablish a consistent sleep-wake cycle. Target Date: 2022-03-08 Frequency: Biweekly Modality: individual Progress: 80%  Related Problem: Learn and implement coping skills that result in a reduction of anxiety and worry, and improved daily functioning. Description: Cooperate with a medication evaluation by a physician. Target Date: 2022-03-08 Frequency: Biweekly Modality: individual Progress: 100%  Related Problem: Learn and implement coping skills that result in a reduction of anxiety and worry, and improved daily functioning. Description: Learn and implement calming skills to reduce overall anxiety and manage anxiety symptoms. Target Date: 2022-03-08 Frequency: Biweekly Modality: individual Progress: 60%  Related Problem: Learn and implement coping skills that result in a reduction of anxiety and worry, and improved daily functioning. Description: Identify, challenge, and replace biased, fearful self-talk with positive, realistic, and empowering self-talk. Target Date: 2022-03-08 Frequency: Biweekly Modality: individual Progress: 50%  Planned Intervention: Assign the client a homework exercise in which he/she identifies fearful self-talk, identifies biases in the self-talk, generates alternatives, and tests through behavioral experiments (or assign "Negative Thoughts Trigger Negative Feelings" in the Adult Psychotherapy Homework Planner by [facility]); review and reinforce success, providing corrective feedback toward improvement.  Related Problem: Learn and implement coping skills that result in a reduction of anxiety and worry, and improved daily functioning. Description: Learn and implement personal and interpersonal skills to reduce anxiety and improve interpersonal relationships. Target Date: 2022-03-08 Frequency: Biweekly Modality: individual Progress: 30%  Planned Intervention: Assign the client a homework exercise in which he/she implements communication skills training  into his/her daily life (or assign "Restoring Socialization Comfort" in the Adult Psychotherapy Homework Planner by [facility]); review, reinforce success, and provide corrective feedback toward improvement.  Related Problem: Learn and implement coping skills that result in a reduction of anxiety and worry, and improved daily functioning. Description: Identify the major life conflicts from the past and present that form the basis for present anxiety. Target Date: 2022-03-08 Frequency: Biweekly Modality: individual Progress: 40%  Related Problem: Reach  a level of reduced tension, increased satisfaction, and improved communication with family and/or other authority figures. Description: Describe the conflicts and the causes of conflicts between self and parents. Target Date: 2022-03-08 Frequency: Biweekly Modality: individual Progress: 40%  Related Problem: Reach a level of reduced tension, increased satisfaction, and improved communication with family and/or other authority figures. Description: Identify own as well as others' role in the family conflicts. Target Date: 2022-03-08 Frequency: Biweekly Modality: individual Progress: 30%  Client Response full compliance  Service Location Location, [address] [place], [place] [address]  Service Code cpt [phone]) Mindfulness training  Facilitate problem solving  Clarify interpersonal incident (IPT)  Validate/empathize  Identify/label emotions  Rationally challenge thoughts or beliefs/cognitive restructuring  Identify automatic thoughts  Normalize/Reframe Make connections between past and present  Comments  Today I met with Amanda [name] in remote video (WebEx) face-to-face in individual psychotherapy l as an accommodation to the [place] pandemic.   Distance Site: Client's Home  Originating Site: Dr Jannifer Franklin Remote Office  Consent: Obtained verbal consent to transmit session remotely.   Anxiety Rating: 2-4   Amanda [name]  reports that she had a very busy Sunday and was rather tired today.  She shared that she had a friend from the spa over and made dinner.  We d/e what she is hoping to do in partnership with this woman to move forward.  We d/ plans she is considering both with this gal and on her own.  We d/e/p that she is leery of people right now and is "beating herself up" for what went wrong.  I helped Amanda [name] to see that it wasn't helpful to focus and what she did wrong, but instead focus on what she will do different the next time.  Amanda [name] is still feeling angry at [facility] and this has kept her from writing her a letter.  I encouraged her to write it anyway to allow herself a place to express all her feelings.  Amanda [name] could then edit what she actually needs to say directly to [place].  She agreed to journal on this subject.   Progress: patient is maintaining at a low level of anxiety and aeb in less frequent anxiety attacks, a decreased unproductive negative thoughts, feelings of inappropriate guilt, overwhelming feelings. Nevertheless, she has been consistent in adding movement to her days and in completing an evening meditation practice most days.    Amanda Crochet, PhD

## 2021-10-07 LAB — FECAL OCCULT BLOOD, GUAIAC: Fecal Occult Blood: NEGATIVE

## 2021-11-01 ENCOUNTER — Ambulatory Visit (INDEPENDENT_AMBULATORY_CARE_PROVIDER_SITE_OTHER): Payer: Medicare Other | Admitting: Psychology

## 2021-11-01 DIAGNOSIS — F411 Generalized anxiety disorder: Secondary | ICD-10-CM

## 2021-11-01 NOTE — Progress Notes (Signed)
?Progress Note: ? ?Patient Name: Christell Steinmiller [facility]  ?Date of Birth: [birth year]  ?MRN: [id number]  ?PCP: Lorrene Reid, PA-C  ? ?Date of Service: 11/01/2021 ?Start: 10/04/2021 10:00 AM ?End: 10/04/2021 10:55 AM ? ?Diagnosis ?F41.1 (Generalized anxiety disorder) ?Symptoms ?A family that is not a stable source of positive influence or support, since family members have little or no contact with each other. (Status: maintained) -- No Description Entered  ?Autonomic hyperactivity (e.g., palpitations, shortness of breath, dry mouth, trouble swallowing, nausea, diarrhea). (Status: maintained) -- No Description Entered  ?Constant or frequent conflict with parents and/or siblings. (Status: maintained) -- No Description Entered  ?Decrease or loss of appetite. (Status: maintained) -- No Description Entered  ?Excessive and/or unrealistic worry that is difficult to control occurring more days than not for at least 6 months about a number of events or activities. (Status: maintained) -- No Description Entered  ?Feelings of hopelessness, worthlessness, or inappropriate guilt. (Status: maintained) -- No Description Entered  ?Hypervigilance (e.g., feeling constantly on edge, experiencing concentration difficulties, having trouble falling or staying asleep, exhibiting a general state of irritability). (Status: maintained) -- No Description Entered  ?Irritable mood. (Status: maintained) -- No Description Entered  ?Low self-esteem. (Status: maintained) -- No Description Entered  ?Motor tension (e.g., restlessness, tiredness, shakiness, muscle tension). (Status: maintained) -- No Description Entered  ?Psychomotor agitation or retardation. (Status: maintained) -- No Description Entered  ?Sleeplessness or hypersomnia. (Status: maintained) -- No Description Entered  ?Medication Status ?compliance  ?Safety ?none  ?If Suicidal or Homicidal State Action Taken: unspecified  ?Current Risk: low ?Medications ?unspecified ?Objectives ?Related  Problem: Learn and implement coping skills that result in a reduction of anxiety and worry, and improved daily functioning. ?Description: Reestablish a consistent sleep-wake cycle. ?Target Date: 2022-03-08 ?Frequency: Biweekly ?Modality: individual ?Progress: 80% ? ?Related Problem: Learn and implement coping skills that result in a reduction of anxiety and worry, and improved daily functioning. ?Description: Cooperate with a medication evaluation by a physician. ?Target Date: 2022-03-08 ?Frequency: Biweekly ?Modality: individual ?Progress: 100% ? ?Related Problem: Learn and implement coping skills that result in a reduction of anxiety and worry, and improved daily functioning. ?Description: Learn and implement calming skills to reduce overall anxiety and manage anxiety symptoms. ?Target Date: 2022-03-08 ?Frequency: Biweekly ?Modality: individual ?Progress: 60% ? ?Related Problem: Learn and implement coping skills that result in a reduction of anxiety and worry, and improved daily functioning. ?Description: Identify, challenge, and replace biased, fearful self-talk with positive, realistic, and empowering self-talk. ?Target Date: 2022-03-08 ?Frequency: Biweekly ?Modality: individual ?Progress: 50% ? ?Planned Intervention: Assign the client a homework exercise in which he/she identifies fearful self-talk, identifies biases in the self-talk, generates alternatives, and tests through behavioral experiments (or assign "Negative Thoughts Trigger Negative Feelings" in the Adult Psychotherapy Homework Planner by [facility]); review and reinforce success, providing corrective feedback toward improvement.  ?Related Problem: Learn and implement coping skills that result in a reduction of anxiety and worry, and improved daily functioning. ?Description: Learn and implement personal and interpersonal skills to reduce anxiety and improve interpersonal relationships. ?Target Date: 2022-03-08 ?Frequency: Biweekly ?Modality:  individual ?Progress: 30% ? ?Planned Intervention: Assign the client a homework exercise in which he/she implements communication skills training into his/her daily life (or assign "Restoring Socialization Comfort" in the Adult Psychotherapy Homework Planner by [facility]); review, reinforce success, and provide corrective feedback toward improvement.  ?Related Problem: Learn and implement coping skills that result in a reduction of anxiety and worry, and improved daily functioning. ?Description: Identify the major life conflicts  from the past and present that form the basis for present anxiety. ?Target Date: 2022-03-08 ?Frequency: Biweekly ?Modality: individual ?Progress: 40% ? ?Related Problem: Reach a level of reduced tension, increased satisfaction, and improved communication with family and/or other authority figures. ?Description: Describe the conflicts and the causes of conflicts between self and parents. ?Target Date: 2022-03-08 ?Frequency: Biweekly ?Modality: individual ?Progress: 40% ? ?Related Problem: Reach a level of reduced tension, increased satisfaction, and improved communication with family and/or other authority figures. ?Description: Identify own as well as others' role in the family conflicts. ?Target Date: 2022-03-08 ?Frequency: Biweekly ?Modality: individual ?Progress: 30% ? ?Client Response ?full compliance  ?Service Location ?Location, [address] [place], [place] [address]  ?Service Code ?cpt [id number] ([id number]) ?Mindfulness training  ?Facilitate problem solving  ?Clarify interpersonal incident (IPT)  ?Validate/empathize  ?Identify/label emotions  ?Rationally challenge thoughts or beliefs/cognitive restructuring  ?Identify automatic thoughts  ?Normalize/Reframe ?Make connections between past and present  ?Comments  ?Today I met with Elza Rafter in remote video (WebEx) face-to-face in individual psychotherapy l as an accommodation to the [place] pandemic.  ? ?Distance Site: Client's Home   ?Originating Site: Dr Jannifer Franklin Remote Office  ?Consent: Obtained verbal consent to transmit session remotely.  ? ?Anxiety Rating: 4-6 ? ?Nikcole reports that she watched a [employer] movie about [facility] and it made her reflect on herself.  I made inquires and then we d/e/p her feelings of inadequacy and self doubt.  She also talked about over thinking because she doesn't trust herself.  We d/e/p her thoughts and their effect.  I directed her to some specific meditations on the Calm app which has proven to be effective with Ivin Booty in the past.  ? ? ?Home Practice:  Calm - 7 Days of Self Esteem and The Self confidence Series ? ? ?Progress: patient is maintaining at a low to moderate levels of anxiety and aeb periodic anxiety attacks, unproductive negative thoughts, feelings of inappropriate guilt and self doubt, and overwhelming feelings. Nevertheless, she has been consistent in adding movement to her days and in completing an evening meditation practice most days.  ? ? ?[name], PhD ? ? ? ? ? ? ? ? ?[name], PhD ?

## 2021-11-29 ENCOUNTER — Ambulatory Visit (INDEPENDENT_AMBULATORY_CARE_PROVIDER_SITE_OTHER): Payer: Medicare Other | Admitting: Psychology

## 2021-11-29 DIAGNOSIS — F411 Generalized anxiety disorder: Secondary | ICD-10-CM

## 2021-11-29 NOTE — Progress Notes (Signed)
?Progress Note: ? ?Patient Name: [facility]  ?Date of Birth: [birth year]  ?MRN: [id number]  ?PCP: Lorrene Reid, PA-C  ? ?Date of Service: 11/29/2021 ?Start:  10:00 AM ?End: 10:55 AM ? ?Annual Review: 03/08/2022 ? ?Diagnosis ?F41.1 (Generalized anxiety disorder) ?Symptoms ?A family that is not a stable source of positive influence or support, since family members have little or no contact with each other.   ?Autonomic hyperactivity (e.g., palpitations, shortness of breath, dry mouth, trouble swallowing, nausea, diarrhea).  ?Constant or frequent conflict with parents and/or siblings.  ?Decrease or loss of appetite. ?Excessive and/or unrealistic worry that is difficult to control occurring more days than not for at least 6 months about a number of events or activities.  ?Feelings of hopelessness, worthlessness, or inappropriate guilt.  ?Hypervigilance (e.g., feeling constantly on edge, experiencing concentration difficulties, having trouble falling or staying asleep, exhibiting a general state of irritability).  ?Low self-esteem.  ?Motor tension (e.g., restlessness, tiredness, shakiness, muscle tension). Psychomotor agitation or retardation.   ?Sleeplessness or hypersomnia.  ? ?Medication Status ?compliance  ?Safety ?none  ?If Suicidal or Homicidal State Action Taken: unspecified  ?Current Risk: low ?Medications ?unspecified ?Objectives ?Related Problem: Learn and implement coping skills that result in a reduction of anxiety and worry, and improved daily functioning. ?Description: Reestablish a consistent sleep-wake cycle. ?Target Date: 2022-03-08 ?Frequency: Biweekly ?Modality: individual ?Progress: 80% ? ?Related Problem: Learn and implement coping skills that result in a reduction of anxiety and worry, and improved daily functioning. ?Description: Cooperate with a medication evaluation by a physician. ?Target Date: 2022-03-08 ?Frequency: Biweekly ?Modality: individual ?Progress: 100% ? ?Related Problem:  Learn and implement coping skills that result in a reduction of anxiety and worry, and improved daily functioning. ?Description: Learn and implement calming skills to reduce overall anxiety and manage anxiety symptoms. ?Target Date: 2022-03-08 ?Frequency: Biweekly ?Modality: individual ?Progress: 60% ? ?Related Problem: Learn and implement coping skills that result in a reduction of anxiety and worry, and improved daily functioning. ?Description: Identify, challenge, and replace biased, fearful self-talk with positive, realistic, and empowering self-talk. ?Target Date: 2022-03-08 ?Frequency: Biweekly ?Modality: individual ?Progress: 50% ? ?Planned Intervention: Assign the client a homework exercise in which he/she identifies fearful self-talk, identifies biases in the self-talk, generates alternatives, and tests through behavioral experiments (or assign "Negative Thoughts Trigger Negative Feelings" in the Adult Psychotherapy Homework Planner by [facility]); review and reinforce success, providing corrective feedback toward improvement.  ?Related Problem: Learn and implement coping skills that result in a reduction of anxiety and worry, and improved daily functioning. ?Description: Learn and implement personal and interpersonal skills to reduce anxiety and improve interpersonal relationships. ?Target Date: 2022-03-08 ?Frequency: Biweekly ?Modality: individual ?Progress: 30% ? ?Planned Intervention: Assign the client a homework exercise in which he/she implements communication skills training into his/her daily life (or assign "Restoring Socialization Comfort" in the Adult Psychotherapy Homework Planner by [facility]); review, reinforce success, and provide corrective feedback toward improvement.  ?Related Problem: Learn and implement coping skills that result in a reduction of anxiety and worry, and improved daily functioning. ?Description: Identify the major life conflicts from the past and present that form the basis for  present anxiety. ?Target Date: 2022-03-08 ?Frequency: Biweekly ?Modality: individual ?Progress: 40% ? ?Related Problem: Reach a level of reduced tension, increased satisfaction, and improved communication with family and/or other authority figures. ?Description: Describe the conflicts and the causes of conflicts between self and parents. ?Target Date: 2022-03-08 ?Frequency: Biweekly ?Modality: individual ?Progress: 40% ? ?Related Problem: Reach a level of reduced  tension, increased satisfaction, and improved communication with family and/or other authority figures. ?Description: Identify own as well as others' role in the family conflicts. ?Target Date: 2022-03-08 ?Frequency: Biweekly ?Modality: individual ?Progress: 30% ? ?Client Response ?full compliance  ?Service Location ?Location, [address] [place], [place] [address]  ?Service Code ?cpt 30865H (95) ?Mindfulness training  ?Facilitate problem solving  ?Clarify interpersonal incident (IPT)  ?Validate/empathize  ?Identify/label emotions  ?Rationally challenge thoughts or beliefs/cognitive restructuring  ?Identify automatic thoughts  ?Normalize/Reframe ?Make connections between past and present  ?Comments  ?Today I met with Elza Rafter in remote video (WebEx) face-to-face in individual psychotherapy l as an accommodation to the [place] pandemic.  ? ?Distance Site: Client's Home  ?Originating Site: Dr Jannifer Franklin Remote Office  ?Consent: Obtained verbal consent to transmit session remotely.  ? ?Anxiety Rating: 4-6 ? ?Gage reports that she has had a tough time these past few weeks.  She states that one of her dogs has been sick and it has taken a while to figure out what was wrong.  Sala was especially frustrated because she felt like they weren't listening to her and it made things go slower that was necessary.  She c/o that the stress was effecting her and that she wasn't feeling well.   As we talked, it was clear that she was very much in her head  worrying, was disconnected from how her body was responding to stress and it was effecting her physical wellbeing.  I strongly encouraged her to be present and more mindful.  I reviewed some mindfulness practices and she agreed to use the Calm app to practice. ? ? ? ?Home Practice:  Calm - 7 Days of Self Esteem and The Self confidence Series ? ? ?Progress: patient is maintaining at a low to moderate levels of anxiety and aeb periodic anxiety attacks, unproductive negative thoughts, feelings of inappropriate guilt and self doubt, and overwhelming feelings. Nevertheless, she has been consistent in adding movement to her days and in completing an evening meditation practice most days.  ? ? ?[name], PhD ? ? ?

## 2021-11-30 ENCOUNTER — Encounter: Payer: Self-pay | Admitting: Physician Assistant

## 2021-12-20 ENCOUNTER — Encounter: Payer: Self-pay | Admitting: Physician Assistant

## 2021-12-20 ENCOUNTER — Ambulatory Visit (INDEPENDENT_AMBULATORY_CARE_PROVIDER_SITE_OTHER): Payer: Medicare Other | Admitting: Physician Assistant

## 2021-12-20 VITALS — BP 119/77 | HR 74 | Temp 97.7°F | Ht 66.0 in | Wt 166.0 lb

## 2021-12-20 DIAGNOSIS — E7800 Pure hypercholesterolemia, unspecified: Secondary | ICD-10-CM

## 2021-12-20 DIAGNOSIS — F411 Generalized anxiety disorder: Secondary | ICD-10-CM | POA: Diagnosis not present

## 2021-12-20 DIAGNOSIS — I10 Essential (primary) hypertension: Secondary | ICD-10-CM | POA: Diagnosis not present

## 2021-12-20 DIAGNOSIS — Z716 Tobacco abuse counseling: Secondary | ICD-10-CM | POA: Diagnosis not present

## 2021-12-20 MED ORDER — CARVEDILOL 3.125 MG PO TABS: 3.1250 mg | ORAL_TABLET | Freq: Two times a day (BID) | ORAL | 1 refills | Status: DC

## 2021-12-20 NOTE — Assessment & Plan Note (Signed)
-  Stable. -Continue current medication regimen.  -Will continue to monitor. 

## 2021-12-20 NOTE — Assessment & Plan Note (Addendum)
-  GAD-7 score mildly elevated secondary to recent stress. Recommend to continue with non-pharmacologic therapy and BH therapy sessions. Will continue to monitor. ?

## 2021-12-20 NOTE — Assessment & Plan Note (Addendum)
-  Last lipid panel: HDL 60, LDL 115 ?-Will continue with dietary and lifestyle interventions. ?-Will continue to monitor. ?

## 2021-12-20 NOTE — Progress Notes (Signed)
?Established patient visit ? ? ?Patient: Amanda [name]   DOB: [birth year]   71 y.o. Female  MRN: [id number] ?Visit Date: 12/20/2021 ? ?Chief Complaint  ?Patient presents with  ? Hypertension  ? ?Subjective  ?  ?HPI  ?Patient presents for follow-up on hypertension, hyperlipidemia and mood. Patient reports was doing good with reduced tobacco use but has been smoking more lately due to stress, tries to stay occupied.  ? ?HTN: Pt denies chest pain, palpitations, dizziness, shortness of breath or edema. Taking medication as directed without side effects.  ? ?HLD: Pt managing with diet. Tries to stay busy and active.  ? ?Mood: Followed by [facility]. Patient is experiencing increased anxiety related to personal events such as quitting her job and also of her dogs is starting chemotherapy.  ? ? ?  12/20/2021  ?  9:56 AM 06/22/2021  ? 10:41 AM 12/17/2020  ? 10:04 AM 06/11/2020  ? 10:48 AM 02/04/2020  ?  9:49 AM  ?Depression screen PHQ 2/9  ?Decreased Interest 0 0 0 0 0  ?Down, Depressed, Hopeless 0 1 0 0 1  ?PHQ - 2 Score 0 1 0 0 1  ?Altered sleeping 0 1 0 0 0  ?Tired, decreased energy 1 1 0 0 1  ?Change in appetite 0 0 0 0 0  ?Feeling bad or failure about yourself  1 1 0 1 0  ?Trouble concentrating 0 0 0 0 0  ?Moving slowly or fidgety/restless 0 0 0 0 0  ?Suicidal thoughts 0 0 0 0 0  ?PHQ-9 Score 2 4 0 1 2  ?Difficult doing work/chores Not difficult at all Not difficult at all Not difficult at all Not difficult at all Not difficult at all  ? ? ?  12/20/2021  ?  9:57 AM 06/22/2021  ? 10:41 AM  ?GAD 7 : Generalized Anxiety Score  ?Nervous, Anxious, on Edge 1 1  ?Control/stop worrying 1 0  ?Worry too much - different things 1 0  ?Trouble relaxing 1 0  ?Restless 1 0  ?Easily annoyed or irritable 1 0  ?Afraid - awful might happen 1 0  ?Total GAD 7 Score 7 1  ?Anxiety Difficulty Somewhat difficult Not difficult at all  ? ? ?  ? ? ?Medications: ?Outpatient Medications Prior to Visit  ?Medication Sig  ? nicotine (NICODERM CQ - DOSED IN  MG/24 HOURS) 14 mg/24hr patch Place 1 patch (14 mg total) onto the skin daily.  ? [DISCONTINUED] carvedilol (COREG) 3.125 MG tablet Take 1 tablet (3.125 mg total) by mouth 2 (two) times daily with a meal.  ? ?No facility-administered medications prior to visit.  ? ? ?Review of Systems ?Review of Systems:  ?A fourteen system review of systems was performed and found to be positive as per HPI. ? ?Last CBC ?Lab Results  ?Component Value Date  ? WBC 7.5 07/06/2021  ? HGB 13.1 07/06/2021  ? HCT 39.7 07/06/2021  ? MCV 98 (H) 07/06/2021  ? MCH 32.3 07/06/2021  ? RDW 11.7 07/06/2021  ? PLT 359 07/06/2021  ? ?Last metabolic panel ?Lab Results  ?Component Value Date  ? GLUCOSE 87 07/06/2021  ? NA 146 (H) 07/06/2021  ? K 4.2 07/06/2021  ? CL 107 (H) 07/06/2021  ? CO2 26 07/06/2021  ? BUN 16 07/06/2021  ? CREATININE 0.58 07/06/2021  ? EGFR 96 07/06/2021  ? CALCIUM 8.7 07/06/2021  ? PROT 5.5 (L) 07/06/2021  ? ALBUMIN 3.4 (L) 07/06/2021  ? LABGLOB 2.1 07/06/2021  ? AGRATIO  1.6 07/06/2021  ? BILITOT 0.3 07/06/2021  ? ALKPHOS 93 07/06/2021  ? AST 13 07/06/2021  ? ALT 13 07/06/2021  ? ?Last lipids ?Lab Results  ?Component Value Date  ? CHOL 193 07/06/2021  ? HDL 60 07/06/2021  ? [place] 115 (H) 07/06/2021  ? TRIG 102 07/06/2021  ? CHOLHDL 3.2 07/06/2021  ? ?Last hemoglobin A1c ?Lab Results  ?Component Value Date  ? HGBA1C 5.9 (H) 07/06/2021  ? ?Last thyroid functions ?Lab Results  ?Component Value Date  ? TSH 2.020 07/06/2021  ? ?Last vitamin D ?No results found for: 25OHVITD2, [place], VD25OH ?  Objective  ?  ?BP 119/77   Pulse 74   Temp 97.7 ?F (36.5 ?C)   Ht '5\' 6"'  (1.676 m)   Wt 166 lb (75.3 kg)   SpO2 97%   BMI 26.79 kg/m?  ?BP Readings from Last 3 Encounters:  ?12/20/21 119/77  ?06/22/21 109/73  ?12/17/20 115/78  ? ?Wt Readings from Last 3 Encounters:  ?12/20/21 166 lb (75.3 kg)  ?06/22/21 161 lb (73 kg)  ?12/17/20 161 lb (73 kg)  ? ? ?Physical Exam  ?General:  Well Developed, well nourished, appropriate for stated age.   ?Neuro:  Alert and oriented,  extra-ocular muscles intact  ?HEENT:  Normocephalic, atraumatic, neck supple  ?Skin:  no gross rash, warm, pink. ?Cardiac:  RRR, S1 S2 ?Respiratory: Scattered rhonchi, no wheezing, crackles or rales.  ?Vascular:  Ext warm, no cyanosis apprec.; cap RF less 2 sec. ?Psych:  No HI/SI, judgement and insight good, Euthymic mood. Full Affect. ? ? ?No results found for any visits on 12/20/21. ? Assessment & Plan  ?  ? ? ?Problem List Items Addressed This Visit   ? ?  ? Cardiovascular and Mediastinum  ? Hypertension (Chronic)  ?  -Stable. ?-Continue current medication regimen. ?-Will continue to monitor. ? ?  ?  ? Relevant Medications  ? carvedilol (COREG) 3.125 MG tablet  ?  ? Other  ? GAD (generalized anxiety disorder) - Primary  ?  -GAD-7 score mildly elevated secondary to recent stress. Recommend to continue with non-pharmacologic therapy and BH therapy sessions. Will continue to monitor. ? ?  ?  ? Elevated LDL cholesterol level  ?  -Last lipid panel: HDL 60, LDL 115 ?-Will continue with dietary and lifestyle interventions. ?-Will continue to monitor. ? ?  ?  ? ?Other Visit Diagnoses   ? ? Encounter for tobacco use cessation counseling      ? ?  ? ?Encounter for tobacco use cessation counseling: ?-The patient was counseled on the dangers of tobacco use, and was advised to quit.  Reviewed strategies to maximize success, including stress management and support of family/friends. ? ? ? ?Return in about 6 months (around 06/22/2022) for [place] and [place].  ?   ? ? ? ?Lorrene Reid, PA-C  ?[facility] ?[phone] (phone) ?[phone] (fax) ? ?[facility] ?

## 2021-12-20 NOTE — Patient Instructions (Signed)
Managing Anxiety This video describes anxiety and what can be done to manage it. To view the content, go to this web address: [email or web address]  This video will expire on: 10/26/2023. If you need access to this video following this date, please reach out to the healthcare provider who assigned it to you. This information is not intended to replace advice given to you by your health care provider. Make sure you discuss any questions you have with your health care provider. Elsevier Patient Education  2023 [employer]vier Inc[employer]  

## 2022-01-06 ENCOUNTER — Ambulatory Visit (INDEPENDENT_AMBULATORY_CARE_PROVIDER_SITE_OTHER): Payer: Medicare Other | Admitting: Psychology

## 2022-01-06 DIAGNOSIS — F411 Generalized anxiety disorder: Secondary | ICD-10-CM | POA: Diagnosis not present

## 2022-01-06 NOTE — Progress Notes (Signed)
Progress Note:  Patient Name: Amanda [name]  Date of Birth: 08-16-1950  MRN: [id number]  PCP: Lorrene Reid, PA-C   Date of Service: 01/06/2022 Start:  10:00 AM End: 10:55 AM  Annual Review: 03/08/2022  Diagnosis F41.1 (Generalized anxiety disorder) Symptoms A family that is not a stable source of positive influence or support, since family members have little or no contact with each other.   Autonomic hyperactivity (e.g., palpitations, shortness of breath, dry mouth, trouble swallowing, nausea, diarrhea).  Constant or frequent conflict with parents and/or siblings.  Decrease or loss of appetite. Excessive and/or unrealistic worry that is difficult to control occurring more days than not for at least 6 months about a number of events or activities.  Feelings of hopelessness, worthlessness, or inappropriate guilt.  Hypervigilance (e.g., feeling constantly on edge, experiencing concentration difficulties, having trouble falling or staying asleep, exhibiting a general state of irritability).  Low self-esteem.  Motor tension (e.g., restlessness, tiredness, shakiness, muscle tension). Psychomotor agitation or retardation.   Sleeplessness or hypersomnia.   Medication Status compliance  Safety none  If Suicidal or Homicidal State Action Taken: unspecified  Current Risk: low Medications unspecified Objectives Related Problem: Learn and implement coping skills that result in a reduction of anxiety and worry, and improved daily functioning. Description: Reestablish a consistent sleep-wake cycle. Target Date: 2022-03-08 Frequency: Biweekly Modality: individual Progress: 80%  Related Problem: Learn and implement coping skills that result in a reduction of anxiety and worry, and improved daily functioning. Description: Cooperate with a medication evaluation by a physician. Target Date: 2022-03-08 Frequency: Biweekly Modality: individual Progress: 100%  Related Problem:  Learn and implement coping skills that result in a reduction of anxiety and worry, and improved daily functioning. Description: Learn and implement calming skills to reduce overall anxiety and manage anxiety symptoms. Target Date: 2022-03-08 Frequency: Biweekly Modality: individual Progress: 60%  Related Problem: Learn and implement coping skills that result in a reduction of anxiety and worry, and improved daily functioning. Description: Identify, challenge, and replace biased, fearful self-talk with positive, realistic, and empowering self-talk. Target Date: 2022-03-08 Frequency: Biweekly Modality: individual Progress: 50%  Planned Intervention: Assign the client a homework exercise in which he/she identifies fearful self-talk, identifies biases in the self-talk, generates alternatives, and tests through behavioral experiments (or assign "Negative Thoughts Trigger Negative Feelings" in the Adult Psychotherapy Homework Planner by [facility] review and reinforce success, providing corrective feedback toward improvement.  Related Problem: Learn and implement coping skills that result in a reduction of anxiety and worry, and improved daily functioning. Description: Learn and implement personal and interpersonal skills to reduce anxiety and improve interpersonal relationships. Target Date: 2022-03-08 Frequency: Biweekly Modality: individual Progress: 30%  Planned Intervention: Assign the client a homework exercise in which he/she implements communication skills training into his/her daily life (or assign "Restoring Socialization Comfort" in the Adult Psychotherapy Homework Planner by [facility]); review, reinforce success, and provide corrective feedback toward improvement.  Related Problem: Learn and implement coping skills that result in a reduction of anxiety and worry, and improved daily functioning. Description: Identify the major life conflicts from the past and present that form the basis for  present anxiety. Target Date: 2022-03-08 Frequency: Biweekly Modality: individual Progress: 40%  Related Problem: Reach a level of reduced tension, increased satisfaction, and improved communication with family and/or other authority figures. Description: Describe the conflicts and the causes of conflicts between self and parents. Target Date: 2022-03-08 Frequency: Biweekly Modality: individual Progress: 40%  Related Problem: Reach a level of reduced  tension, increased satisfaction, and improved communication with family and/or other authority figures. Description: Identify own as well as others' role in the family conflicts. Target Date: 2022-03-08 Frequency: Biweekly Modality: individual Progress: 30%  Client Response full compliance  Service Location Location, [address] [place], [place] [address]  Service Code cpt [phone]) Mindfulness training  Facilitate problem solving  Clarify interpersonal incident (IPT)  Validate/empathize  Identify/label emotions  Rationally challenge thoughts or beliefs/cognitive restructuring  Identify automatic thoughts  Normalize/Reframe Make connections between past and present  Comments  Today I met with Amanda [name] in remote video (WebEx) face-to-face in individual psychotherapy l as an accommodation to the [place] pandemic.   Distance Site: Client's Home  Originating Site: Dr Jannifer Franklin Remote Office  Consent: Obtained verbal consent to transmit session remotely.   Anxiety Rating: 4-6  Amanda [name] reports that she was sad.  She had to put down one of her dogs.  I helped her to grieve the loss.  Amanda [name] shared her regrets and plans going forward.     Home Practice:  Calm - 7 Days of Self Esteem and The Self confidence Series   Progress: patient is maintaining at a low to moderate levels of anxiety and aeb periodic anxiety attacks, unproductive negative thoughts, feelings of inappropriate guilt and self doubt, and overwhelming  feelings. Nevertheless, she has been consistent in adding movement to her days and in completing an evening meditation practice most days.    Amanda Crochet, PhD

## 2022-01-31 ENCOUNTER — Ambulatory Visit (INDEPENDENT_AMBULATORY_CARE_PROVIDER_SITE_OTHER): Payer: Medicare Other | Admitting: Psychology

## 2022-01-31 DIAGNOSIS — F411 Generalized anxiety disorder: Secondary | ICD-10-CM | POA: Diagnosis not present

## 2022-02-09 IMAGING — MG DIGITAL SCREENING BILAT W/ TOMO W/ CAD
6 of 10 series · 6 of 30 positions shown · non-contrast
Comparison: Previous exam(s).

CLINICAL DATA: Screening.

EXAM:
DIGITAL SCREENING BILATERAL MAMMOGRAM WITH TOMO AND CAD

[R MLO synth-2D (1 of 2)]
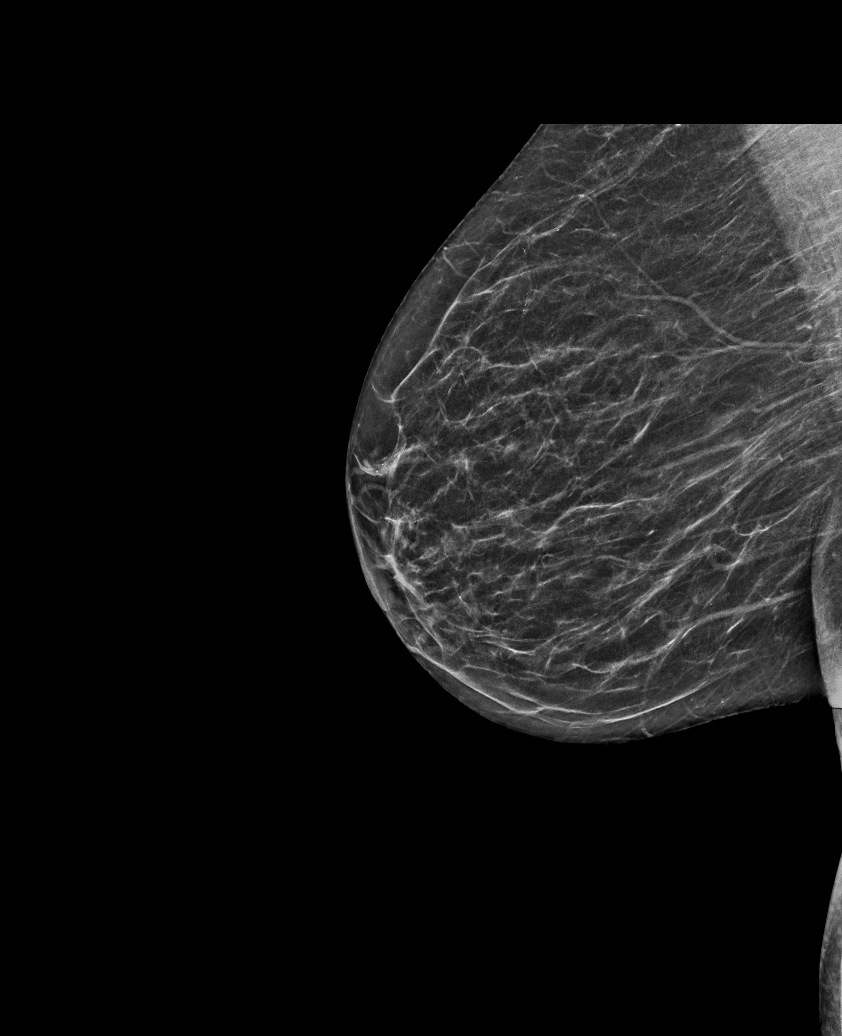

[L CC synth-2D]
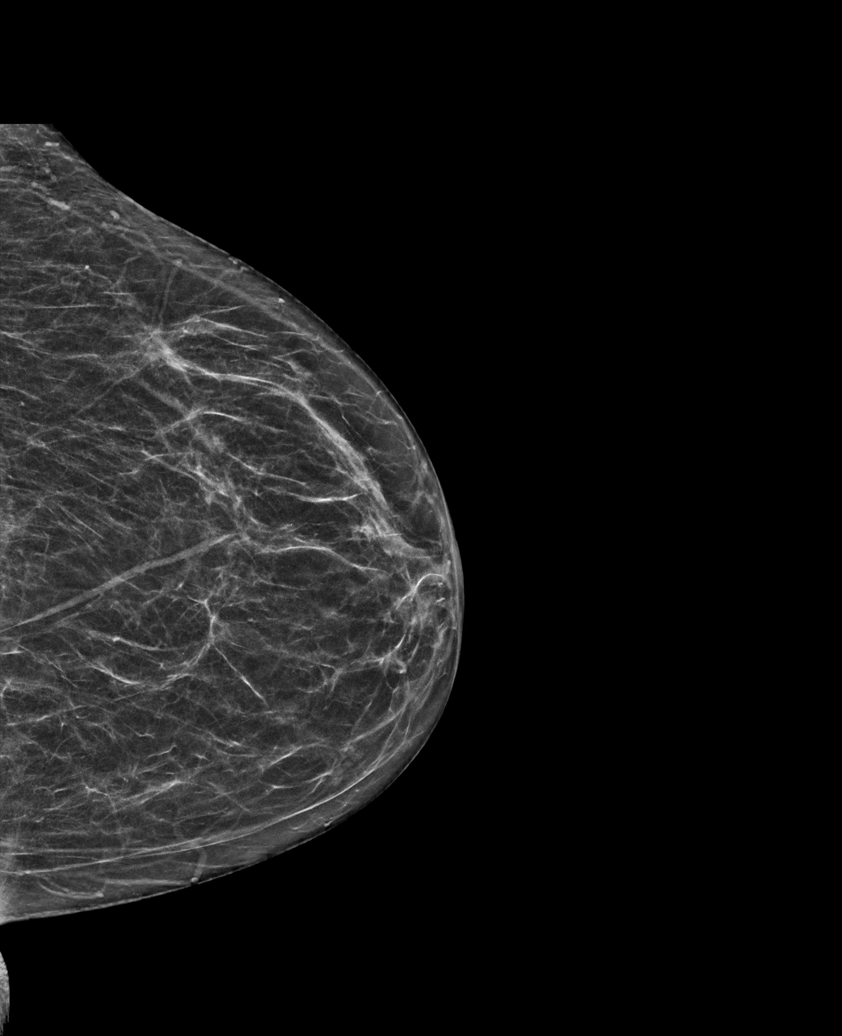

[R CC synth-2D]
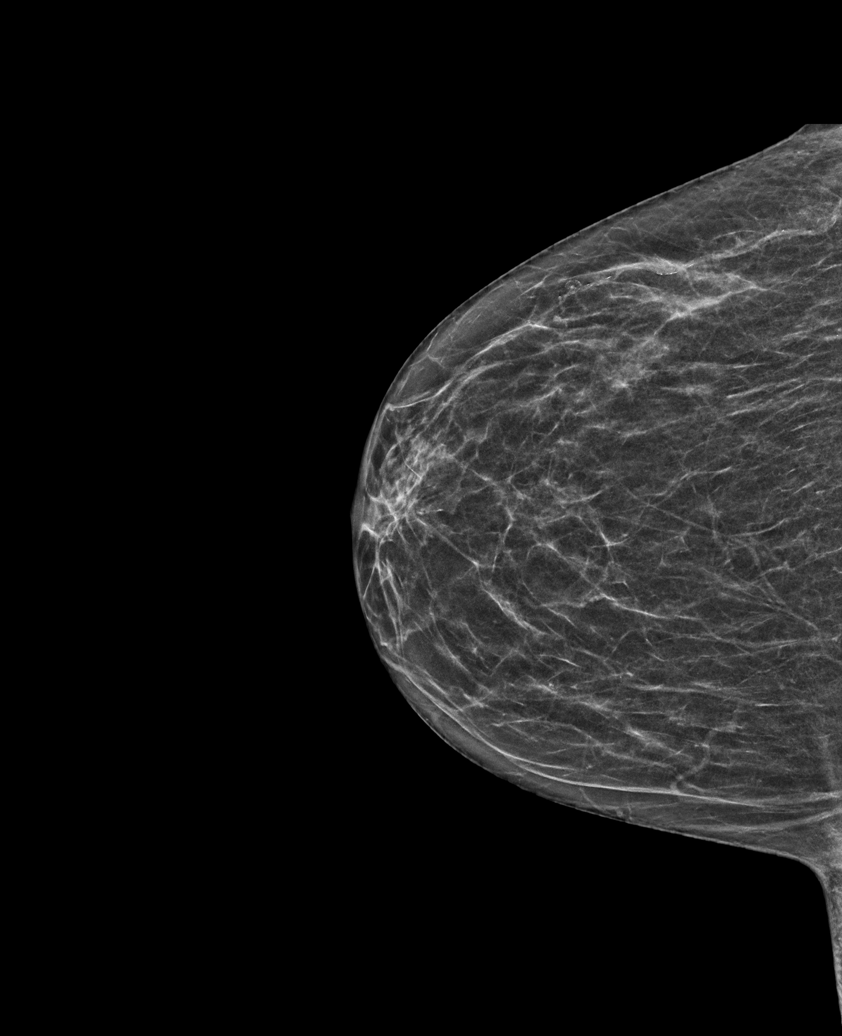

[R MLO synth-2D (2 of 2)]
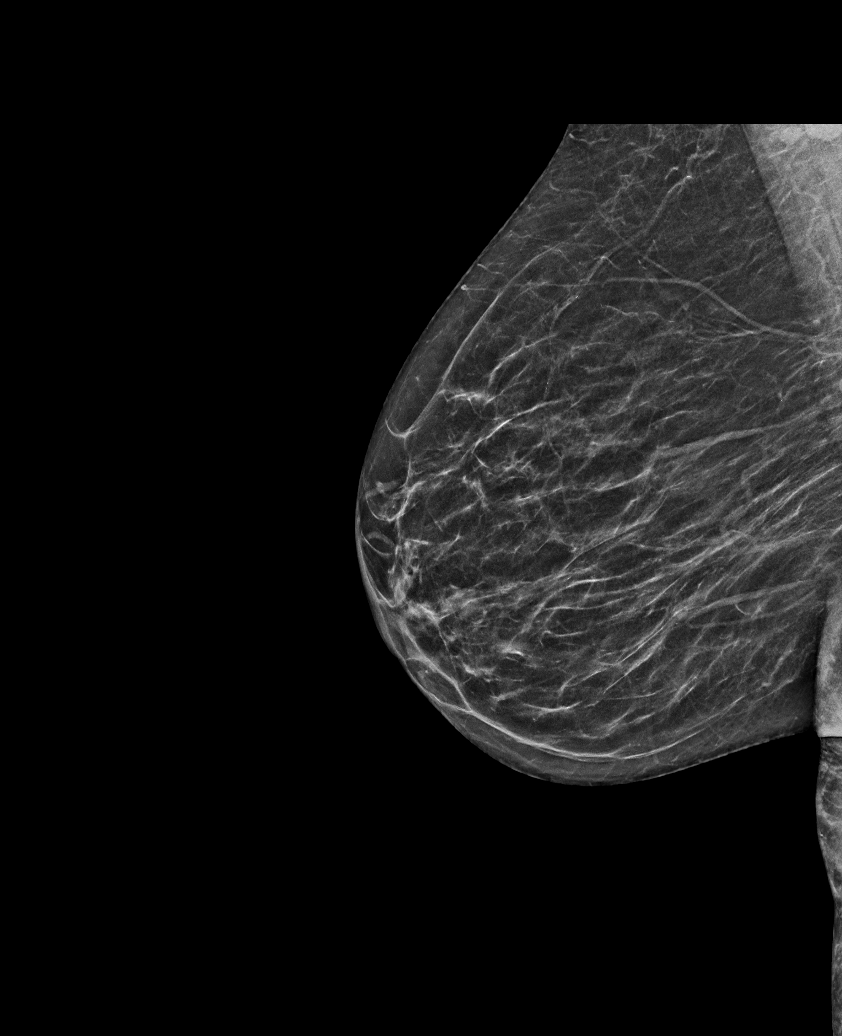

[L MLO synth-2D]
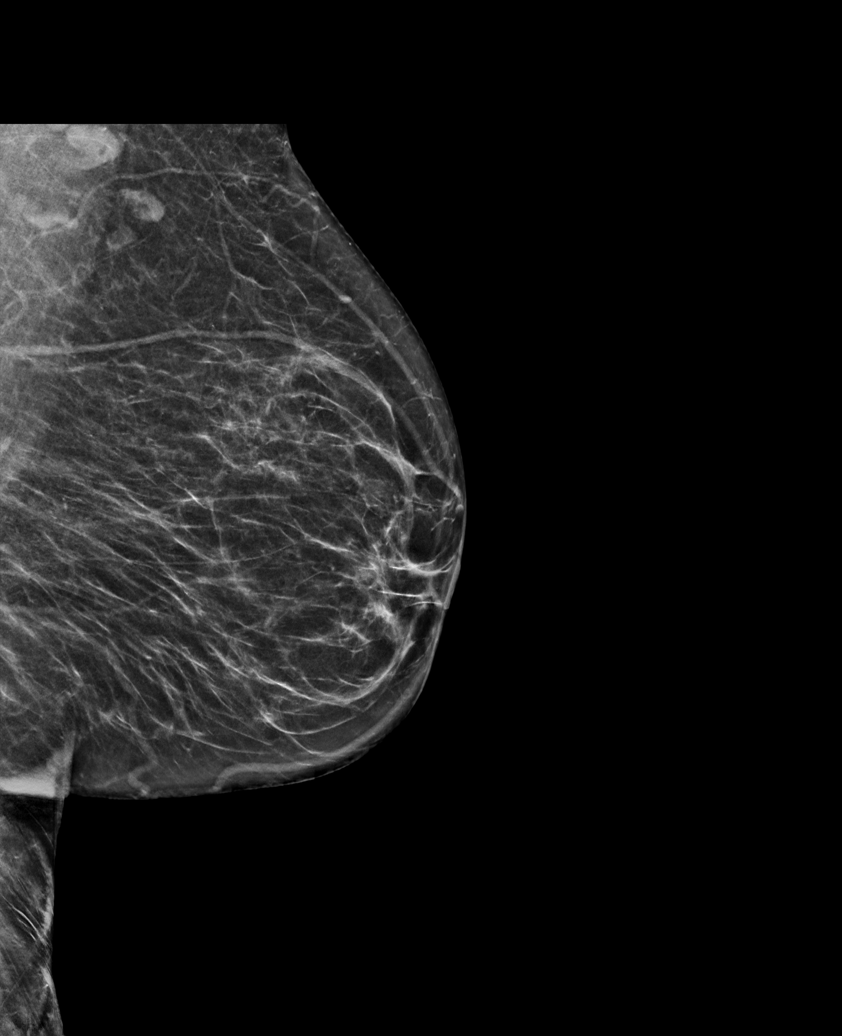

[R CC tomo · tomo slice 26/51.0]
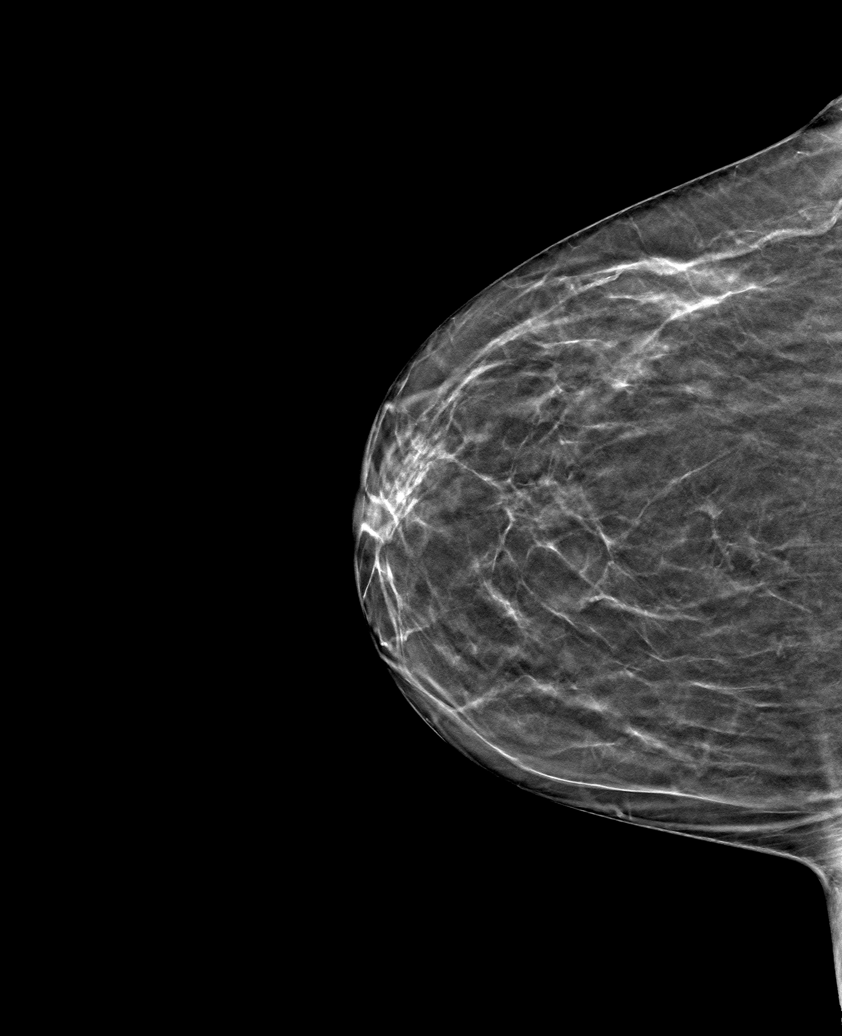

[6 of 30 positions shown; findings below may reference images not displayed]

ACR Breast Density Category b: There are scattered areas of
fibroglandular density.
FINDINGS: There are no findings suspicious for malignancy. Images were
processed with CAD.
IMPRESSION: No mammographic evidence of malignancy. A result letter of this
screening mammogram will be mailed directly to the patient.

RECOMMENDATION:
Screening mammogram in one year. (Code:CN-U-775)

BI-RADS CATEGORY  1: Negative.

## 2022-03-07 ENCOUNTER — Telehealth: Payer: Self-pay | Admitting: Physician Assistant

## 2022-03-07 ENCOUNTER — Ambulatory Visit (INDEPENDENT_AMBULATORY_CARE_PROVIDER_SITE_OTHER): Payer: Medicare Other | Admitting: Psychology

## 2022-03-07 DIAGNOSIS — F411 Generalized anxiety disorder: Secondary | ICD-10-CM

## 2022-03-07 MED ORDER — DULOXETINE HCL 20 MG PO CPEP: 20.0000 mg | ORAL_CAPSULE | Freq: Every day | ORAL | 1 refills | Status: DC
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
## 2022-03-07 NOTE — Telephone Encounter (Signed)
lmtc

## 2022-03-07 NOTE — Telephone Encounter (Signed)
Patient seen psychologist this morning and she wants her to be put on Cymbalta but she cannot write prescriptions so patient is requesting a prescription from here. Please advise.

## 2022-03-07 NOTE — Telephone Encounter (Signed)
Patient aware.

## 2022-03-23 ENCOUNTER — Ambulatory Visit (INDEPENDENT_AMBULATORY_CARE_PROVIDER_SITE_OTHER): Payer: Medicare Other | Admitting: Psychology

## 2022-03-23 DIAGNOSIS — F411 Generalized anxiety disorder: Secondary | ICD-10-CM | POA: Diagnosis not present
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
## 2022-04-04 ENCOUNTER — Ambulatory Visit (INDEPENDENT_AMBULATORY_CARE_PROVIDER_SITE_OTHER): Payer: Medicare Other | Admitting: Psychology

## 2022-04-04 DIAGNOSIS — F330 Major depressive disorder, recurrent, mild: Secondary | ICD-10-CM

## 2022-04-04 DIAGNOSIS — F411 Generalized anxiety disorder: Secondary | ICD-10-CM
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
## 2022-04-12 ENCOUNTER — Other Ambulatory Visit: Payer: Self-pay | Admitting: Physician Assistant

## 2022-04-12 DIAGNOSIS — Z1231 Encounter for screening mammogram for malignant neoplasm of breast: Secondary | ICD-10-CM

## 2022-04-18 ENCOUNTER — Ambulatory Visit (INDEPENDENT_AMBULATORY_CARE_PROVIDER_SITE_OTHER): Payer: Medicare Other | Admitting: Psychology

## 2022-04-18 DIAGNOSIS — F411 Generalized anxiety disorder: Secondary | ICD-10-CM
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
## 2022-04-20 ENCOUNTER — Ambulatory Visit: Payer: Medicare Other | Admitting: Psychology

## 2022-05-02 ENCOUNTER — Ambulatory Visit (INDEPENDENT_AMBULATORY_CARE_PROVIDER_SITE_OTHER): Payer: Medicare Other | Admitting: Psychology

## 2022-05-02 DIAGNOSIS — F411 Generalized anxiety disorder: Secondary | ICD-10-CM | POA: Diagnosis not present
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
## 2022-05-03 ENCOUNTER — Ambulatory Visit: Payer: Medicare Other

## 2022-05-06 ENCOUNTER — Ambulatory Visit
Admission: RE | Admit: 2022-05-06 | Discharge: 2022-05-06 | Disposition: A | Discharge status: Home / Self Care | Payer: Medicare Other | Source: Ambulatory Visit | Attending: Physician Assistant | Admitting: Physician Assistant

## 2022-05-06 DIAGNOSIS — Z1231 Encounter for screening mammogram for malignant neoplasm of breast: Secondary | ICD-10-CM | POA: Diagnosis not present

## 2022-05-16 ENCOUNTER — Ambulatory Visit (INDEPENDENT_AMBULATORY_CARE_PROVIDER_SITE_OTHER): Payer: Medicare Other | Admitting: Psychology

## 2022-05-16 DIAGNOSIS — F411 Generalized anxiety disorder: Secondary | ICD-10-CM
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
## 2022-05-18 ENCOUNTER — Telehealth: Payer: Self-pay

## 2022-05-18 NOTE — Telephone Encounter (Signed)
Spoke with patient, patient will contact pharmacy!

## 2022-05-18 NOTE — Telephone Encounter (Signed)
Patient called office to see if she could refill on her medications carvedilol, DULoxetine,  before she goes to [facility] on Oct 26th until Nov 2nd, please advise, thanks!

## 2022-05-30 ENCOUNTER — Ambulatory Visit (INDEPENDENT_AMBULATORY_CARE_PROVIDER_SITE_OTHER): Payer: Medicare Other | Admitting: Psychology

## 2022-05-30 DIAGNOSIS — F411 Generalized anxiety disorder: Secondary | ICD-10-CM
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
## 2022-06-06 ENCOUNTER — Ambulatory Visit: Payer: Medicare Other | Admitting: Psychology

## 2022-06-13 ENCOUNTER — Ambulatory Visit: Payer: Medicare Other | Admitting: Psychology

## 2022-06-14 ENCOUNTER — Ambulatory Visit (INDEPENDENT_AMBULATORY_CARE_PROVIDER_SITE_OTHER): Payer: Medicare Other | Admitting: Psychology

## 2022-06-14 DIAGNOSIS — F411 Generalized anxiety disorder: Secondary | ICD-10-CM | POA: Diagnosis not present
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
## 2022-06-21 ENCOUNTER — Other Ambulatory Visit: Payer: Medicare Other

## 2022-06-21 ENCOUNTER — Other Ambulatory Visit: Payer: Self-pay

## 2022-06-21 DIAGNOSIS — Z0000 Encounter for general adult medical examination without abnormal findings: Secondary | ICD-10-CM

## 2022-06-21 DIAGNOSIS — Z130 Encounter for screening for diseases of the blood and blood-forming organs and certain disorders involving the immune mechanism: Secondary | ICD-10-CM

## 2022-06-21 DIAGNOSIS — E7800 Pure hypercholesterolemia, unspecified: Secondary | ICD-10-CM | POA: Diagnosis not present

## 2022-06-21 DIAGNOSIS — Z1321 Encounter for screening for nutritional disorder: Secondary | ICD-10-CM | POA: Diagnosis not present

## 2022-06-21 DIAGNOSIS — Z13228 Encounter for screening for other metabolic disorders: Secondary | ICD-10-CM | POA: Diagnosis not present

## 2022-06-21 DIAGNOSIS — Z131 Encounter for screening for diabetes mellitus: Secondary | ICD-10-CM | POA: Diagnosis not present

## 2022-06-21 DIAGNOSIS — Z1329 Encounter for screening for other suspected endocrine disorder: Secondary | ICD-10-CM | POA: Diagnosis not present

## 2022-06-22 LAB — COMPREHENSIVE METABOLIC PANEL
ALT: 10 IU/L (ref 0–32)
AST: 11 IU/L (ref 0–40)
Albumin/Globulin Ratio: 1.7 (ref 1.2–2.2)
Albumin: 3.8 g/dL (ref 3.8–4.8)
Alkaline Phosphatase: 95 IU/L (ref 44–121)
BUN/Creatinine Ratio: 25 (ref 12–28)
BUN: 15 mg/dL (ref 8–27)
Bilirubin Total: 0.3 mg/dL (ref 0.0–1.2)
CO2: 23 mmol/L (ref 20–29)
Calcium: 9.3 mg/dL (ref 8.7–10.3)
Chloride: 105 mmol/L (ref 96–106)
Creatinine, Ser: 0.59 mg/dL (ref 0.57–1.00)
Globulin, Total: 2.2 g/dL (ref 1.5–4.5)
Glucose: 105 mg/dL — ABNORMAL HIGH (ref 70–99)
Potassium: 3.9 mmol/L (ref 3.5–5.2)
Sodium: 144 mmol/L (ref 134–144)
Total Protein: 6 g/dL (ref 6.0–8.5)
eGFR: 95 mL/min/{1.73_m2} (ref >59)

## 2022-06-22 LAB — CBC WITH DIFFERENTIAL/PLATELET
Basophils Absolute: 0.1 10*3/uL (ref 0.0–0.2)
Basos: 1 %
EOS (ABSOLUTE): 0.4 10*3/uL (ref 0.0–0.4)
Eos: 5 %
Hematocrit: 40.5 % (ref 34.0–46.6)
Hemoglobin: 13.5 g/dL (ref 11.1–15.9)
Immature Grans (Abs): 0 10*3/uL (ref 0.0–0.1)
Immature Granulocytes: 0 %
Lymphocytes Absolute: 1.8 10*3/uL (ref 0.7–3.1)
Lymphs: 24 %
MCH: 32.8 pg (ref 26.6–33.0)
MCHC: 33.3 g/dL (ref 31.5–35.7)
MCV: 98 fL — ABNORMAL HIGH (ref 79–97)
Monocytes Absolute: 1 10*3/uL — ABNORMAL HIGH (ref 0.1–0.9)
Monocytes: 14 %
Neutrophils Absolute: 4.2 10*3/uL (ref 1.4–7.0)
Neutrophils: 56 %
Platelets: 434 10*3/uL (ref 150–450)
RBC: 4.12 x10E6/uL (ref 3.77–5.28)
RDW: 11.8 % (ref 11.7–15.4)
WBC: 7.5 10*3/uL (ref 3.4–10.8)

## 2022-06-22 LAB — LIPID PANEL
Chol/HDL Ratio: 3.3 ratio (ref 0.0–4.4)
Cholesterol, Total: 207 mg/dL — ABNORMAL HIGH (ref 100–199)
HDL: 62 mg/dL (ref >39)
LDL Chol Calc (NIH): 124 mg/dL — ABNORMAL HIGH (ref 0–99)
Triglycerides: 118 mg/dL (ref 0–149)
VLDL Cholesterol Cal: 21 mg/dL (ref 5–40)

## 2022-06-22 LAB — TSH: TSH: 2.16 u[IU]/mL (ref 0.450–4.500)

## 2022-06-22 LAB — HEMOGLOBIN A1C
Est. average glucose Bld gHb Est-mCnc: 123 mg/dL
Hgb A1c MFr Bld: 5.9 % — ABNORMAL HIGH (ref 4.8–5.6)

## 2022-06-23 ENCOUNTER — Ambulatory Visit (INDEPENDENT_AMBULATORY_CARE_PROVIDER_SITE_OTHER): Payer: Medicare Other | Admitting: Physician Assistant

## 2022-06-23 ENCOUNTER — Encounter: Payer: Self-pay | Admitting: Physician Assistant

## 2022-06-23 VITALS — BP 137/83 | HR 66 | Resp 18 | Ht 66.0 in | Wt 159.0 lb

## 2022-06-23 DIAGNOSIS — R7303 Prediabetes: Secondary | ICD-10-CM

## 2022-06-23 DIAGNOSIS — E7800 Pure hypercholesterolemia, unspecified: Secondary | ICD-10-CM

## 2022-06-23 DIAGNOSIS — Z0000 Encounter for general adult medical examination without abnormal findings: Secondary | ICD-10-CM | POA: Diagnosis not present

## 2022-06-23 DIAGNOSIS — F411 Generalized anxiety disorder: Secondary | ICD-10-CM

## 2022-06-23 DIAGNOSIS — I10 Essential (primary) hypertension: Secondary | ICD-10-CM

## 2022-06-23 NOTE — Progress Notes (Signed)
Subjective:   Amanda [name] is a 71 y.o. female who presents for Medicare Annual (Subsequent) preventive examination.  Review of Systems    General:   No F/C, wt loss Pulm:   No DIB, SOB, pleuritic chest pain Card:  No CP, palpitations Abd:  No n/v/d or pain Ext:  No inc edema from baseline        Objective:    Today's Vitals   06/23/22 0930  BP: 137/83  Pulse: 66  Resp: 18  SpO2: 97%  Weight: 159 lb (72.1 kg)  Height: 5\' 6"  (1.676 m)  PainSc: 0-No pain   Body mass index is 25.66 kg/m.     07/26/2018    2:33 PM  Advanced Directives  Does Patient Have a Medical Advance Directive? No  Would patient like information on creating a medical advance directive? No - Patient declined    Current Medications (verified) Outpatient Encounter Medications as of 06/23/2022  Medication Sig   carvedilol (COREG) 3.125 MG tablet Take 1 tablet (3.125 mg total) by mouth 2 (two) times daily with a meal.   DULoxetine (CYMBALTA) 20 MG capsule Take 1 capsule (20 mg total) by mouth daily.   nicotine (NICODERM CQ - DOSED IN MG/24 HOURS) 14 mg/24hr patch Place 1 patch (14 mg total) onto the skin daily.   No facility-administered encounter medications on file as of 06/23/2022.    Allergies (verified) Atorvastatin and Penicillins   History: Past Medical History:  Diagnosis Date   Hypertension    Jaw fracture ([place])    Past Surgical History:  Procedure Laterality Date   BREAST SURGERY     "crystals"   CESAREAN SECTION     LEG SURGERY     SPLENECTOMY     Family History  Problem Relation Age of Onset   Heart attack Mother    Hyperlipidemia Mother    Hypertension Mother    Heart disease Mother    Breast cancer Neg Hx    Social History   Socioeconomic History   Marital status: Divorced    Spouse name: Not on file   Number of children: Not on file   Years of education: Not on file   Highest education level: Not on file  Occupational History   Not on file  Tobacco  Use   Smoking status: Every Day    Packs/day: 0.25    Years: 10.00    Total pack years: 2.50    Types: Cigarettes   Smokeless tobacco: Never  Vaping Use   Vaping Use: Never used  Substance and Sexual Activity   Alcohol use: Yes    Alcohol/week: 1.0 standard drink of alcohol    Types: 1 Glasses of wine per week   Drug use: Never   Sexual activity: Not Currently  Other Topics Concern   Not on file  Social History Narrative   Not on file   Social Determinants of Health   Financial Resource Strain: Not on file  Food Insecurity: Not on file  Transportation Needs: Not on file  Physical Activity: Not on file  Stress: Not on file  Social Connections: Not on file    Tobacco Counseling Ready to quit: Not Answered Counseling given: Not Answered   Clinical Intake:     Pain Score: 0-No pain           Diabetic?No         Activities of Daily Living    06/23/2022    9:30 AM 12/20/2021  9:57 AM  In your present state of health, do you have any difficulty performing the following activities:  Hearing? 0 0  Vision? 0 0  Difficulty concentrating or making decisions?  1  Walking or climbing stairs? 0 0  Dressing or bathing? 0 0  Doing errands, shopping? 0 0    Patient Care Team: Mayer Masker, PA-C as PCP - General (Physician Assistant)  Indicate any recent Medical Services you may have received from other than [facility] providers in the past year (date may be approximate).     Assessment:   This is a routine wellness examination for [place].  Hearing/Vision screen No results found.  Dietary issues and exercise activities discussed:  -Discussed a heart healthy diet low in fat and carbohydrates. Tries to stay as active as possible.    Goals Addressed   None   Depression Screen    06/23/2022    9:27 AM 12/20/2021    9:56 AM 06/22/2021   10:41 AM 12/17/2020   10:04 AM 06/11/2020   10:48 AM 02/04/2020    9:49 AM 09/18/2019    2:25 PM  PHQ 2/9 Scores   PHQ - 2 Score 0 0 1 0 0 1 0  PHQ- 9 Score 1 2 4  0 1 2 0    Fall Risk    06/23/2022    9:30 AM 12/20/2021    9:56 AM 06/22/2021   10:40 AM 12/17/2020   10:04 AM 06/11/2020   10:48 AM  Fall Risk   Falls in the past year? 0 0 0 0 1  Number falls in past yr: 0 0 0 0 0  Injury with Fall? 0 0 0 0 1  Risk for fall due to :  No Fall Risks No Fall Risks No Fall Risks History of fall(s)  Follow up  Falls evaluation completed Falls evaluation completed Falls evaluation completed Falls evaluation completed    FALL RISK PREVENTION PERTAINING TO THE HOME:  Any stairs in or around the home? Yes  If so, are there any without handrails? No  Home free of loose throw rugs in walkways, pet beds, electrical cords, etc? No  Adequate lighting in your home to reduce risk of falls? Yes   ASSISTIVE DEVICES UTILIZED TO PREVENT FALLS:  Life alert? No  Use of a cane, walker or w/c? No  Grab bars in the bathroom? Yes  Shower chair or bench in shower? No  Elevated toilet seat or a handicapped toilet? No   TIMED UP AND GO:  Was the test performed? Yes .  Length of time to ambulate 10 feet: 10-15 sec.   Gait steady and fast without use of assistive device  Cognitive Function: wnl's        06/23/2022    9:24 AM 06/22/2021   10:14 AM 06/11/2020   10:49 AM  6CIT Screen  What Year? 0 points 0 points 0 points  What month? 0 points 0 points 0 points  What time? 0 points 0 points 0 points  Count back from 20 0 points 0 points 0 points  Months in reverse 0 points 0 points 0 points  Repeat phrase 2 points 8 points 2 points  Total Score 2 points 8 points 2 points    Immunizations Immunization History  Administered Date(s) Administered   PFIZER(Purple Top)SARS-COV-2 Vaccination 10/06/2019, 10/30/2019   Tdap 08/09/2011    TDAP status: Due, Education has been provided regarding the importance of this vaccine. Advised may receive this vaccine at local pharmacy  or Health Dept. Aware to provide a copy  of the vaccination record if obtained from local pharmacy or Health Dept. Verbalized acceptance and understanding.  Flu Vaccine status: Due, Education has been provided regarding the importance of this vaccine. Advised may receive this vaccine at local pharmacy or Health Dept. Aware to provide a copy of the vaccination record if obtained from local pharmacy or Health Dept. Verbalized acceptance and understanding.  Pneumococcal vaccine status: Due, Education has been provided regarding the importance of this vaccine. Advised may receive this vaccine at local pharmacy or Health Dept. Aware to provide a copy of the vaccination record if obtained from local pharmacy or Health Dept. Verbalized acceptance and understanding.  Covid-19 vaccine status: Completed vaccines  Qualifies for Shingles Vaccine? Yes   Zostavax completed No   Shingrix Completed?: No.    Education has been provided regarding the importance of this vaccine. Patient has been advised to call insurance company to determine out of pocket expense if they have not yet received this vaccine. Advised may also receive vaccine at local pharmacy or Health Dept. Verbalized acceptance and understanding.  Screening Tests Health Maintenance  Topic Date Due   Pneumonia Vaccine 55+ Years old (1 - PCV) Never done   Hepatitis C Screening  Never done   Zoster Vaccines- Shingrix (1 of 2) Never done   DEXA SCAN  Never done   COVID-19 Vaccine (3 - Pfizer series) 12/25/2019   TETANUS/TDAP  08/08/2021   Medicare Annual Wellness (AWV)  06/22/2022   INFLUENZA VACCINE  11/06/2022 (Originally 03/08/2022)   Fecal DNA (Cologuard)  10/23/2022   MAMMOGRAM  05/06/2024   HPV VACCINES  Aged Out    Health Maintenance  Health Maintenance Due  Topic Date Due   Pneumonia Vaccine 23+ Years old (1 - PCV) Never done   Hepatitis C Screening  Never done   Zoster Vaccines- Shingrix (1 of 2) Never done   DEXA SCAN  Never done   COVID-19 Vaccine (3 - Pfizer series)  12/25/2019   TETANUS/TDAP  08/08/2021   Medicare Annual Wellness (AWV)  06/22/2022    Colorectal cancer screening: Type of screening: Cologuard. Completed 10/23/2019. Repeat every 3 years  Mammogram status: Completed 05/06/2022. Repeat every year  Bone Density status: Ordered pt deferred. Pt provided with contact info and advised to call to schedule appt.  Lung Cancer Screening: (Low Dose CT Chest recommended if Age 75-80 years, 30 pack-year currently smoking OR have quit w/in 15years.) does not qualify.   Lung Cancer Screening Referral: n/a  Additional Screening:  Hepatitis C Screening: does qualify; Completed pt deferred  Vision Screening: Recommended annual ophthalmology exams for early detection of glaucoma and other disorders of the eye. Is the patient up to date with their annual eye exam?  No  Who is the provider or what is the name of the office in which the patient attends annual eye exams?  If pt is not established with a provider, would they like to be referred to a provider to establish care? No .   Dental Screening: Recommended annual dental exams for proper oral hygiene  Community Resource Referral / Chronic Care Management: CRR required this visit?  No   CCM required this visit?  No      Plan:  -Discussed with patient most recent lab results which are essentially within normal limits or stable from prior. LDL increased from prior. Discussed diet changes. -Continue BH therapy sessions. -Continue current medication regimen. -Follow-up in 6 months for reg  OV- HTN, HLD, mood and FBW (lipid panel, cmp, A1c)  I have personally reviewed and noted the following in the patient's chart:   Medical and social history Use of alcohol, tobacco or illicit drugs  Current medications and supplements including opioid prescriptions. Patient is not currently taking opioid prescriptions. Functional ability and status Nutritional status Physical activity Advanced  directives List of other physicians Hospitalizations, surgeries, and ER visits in previous 12 months Vitals Screenings to include cognitive, depression, and falls Referrals and appointments  In addition, I have reviewed and discussed with patient certain preventive protocols, quality metrics, and best practice recommendations. A written personalized care plan for preventive services as well as general preventive health recommendations were provided to patient.     Mayer Masker, PA-C   06/23/2022   Nurse Notes: Face to face 20 min.

## 2022-06-23 NOTE — Patient Instructions (Signed)
Preventive Care 65 Years and Older, Female Preventive care refers to lifestyle choices and visits with your health care provider that can promote health and wellness. Preventive care visits are also called wellness exams. What can I expect for my preventive care visit? Counseling Your health care provider may ask you questions about your: Medical history, including: Past medical problems. Family medical history. Pregnancy and menstrual history. History of falls. Current health, including: Memory and ability to understand (cognition). Emotional well-being. Home life and relationship well-being. Sexual activity and sexual health. Lifestyle, including: Alcohol, nicotine or tobacco, and drug use. Access to firearms. Diet, exercise, and sleep habits. Work and work environment. Sunscreen use. Safety issues such as seatbelt and bike helmet use. Physical exam Your health care provider will check your: Height and weight. These may be used to calculate your BMI (body mass index). BMI is a measurement that tells if you are at a healthy weight. Waist circumference. This measures the distance around your waistline. This measurement also tells if you are at a healthy weight and may help predict your risk of certain diseases, such as type 2 diabetes and high blood pressure. Heart rate and blood pressure. Body temperature. Skin for abnormal spots. What immunizations do I need?  Vaccines are usually given at various ages, according to a schedule. Your health care provider will recommend vaccines for you based on your age, medical history, and lifestyle or other factors, such as travel or where you work. What tests do I need? Screening Your health care provider may recommend screening tests for certain conditions. This may include: Lipid and cholesterol levels. Hepatitis C test. Hepatitis B test. HIV (human immunodeficiency virus) test. STI (sexually transmitted infection) testing, if you are at  risk. Lung cancer screening. Colorectal cancer screening. Diabetes screening. This is done by checking your blood sugar (glucose) after you have not eaten for a while (fasting). Mammogram. Talk with your health care provider about how often you should have regular mammograms. BRCA-related cancer screening. This may be done if you have a family history of breast, ovarian, tubal, or peritoneal cancers. Bone density scan. This is done to screen for osteoporosis. Talk with your health care provider about your test results, treatment options, and if necessary, the need for more tests. Follow these instructions at home: Eating and drinking  Eat a diet that includes fresh fruits and vegetables, whole grains, lean protein, and low-fat dairy products. Limit your intake of foods with high amounts of sugar, saturated fats, and salt. Take vitamin and mineral supplements as recommended by your health care provider. Do not drink alcohol if your health care provider tells you not to drink. If you drink alcohol: Limit how much you have to 0-1 drink a day. Know how much alcohol is in your drink. In the [place] one drink equals one 12 oz bottle of beer (355 mL), one 5 oz glass of wine (148 mL), or one 1 oz glass of hard liquor (44 mL). Lifestyle Brush your teeth every morning and night with fluoride toothpaste. Floss one time each day. Exercise for at least 30 minutes 5 or more days each week. Do not use any products that contain nicotine or tobacco. These products include cigarettes, chewing tobacco, and vaping devices, such as e-cigarettes. If you need help quitting, ask your health care provider. Do not use drugs. If you are sexually active, practice safe sex. Use a condom or other form of protection in order to prevent STIs. Take aspirin only as told by   your health care provider. Make sure that you understand how much to take and what form to take. Work with your health care provider to find out whether it  is safe and beneficial for you to take aspirin daily. Ask your health care provider if you need to take a cholesterol-lowering medicine (statin). Find healthy ways to manage stress, such as: Meditation, yoga, or listening to music. Journaling. Talking to a trusted person. Spending time with friends and family. Minimize exposure to UV radiation to reduce your risk of skin cancer. Safety Always wear your seat belt while driving or riding in a vehicle. Do not drive: If you have been drinking alcohol. Do not ride with someone who has been drinking. When you are tired or distracted. While texting. If you have been using any mind-altering substances or drugs. Wear a helmet and other protective equipment during sports activities. If you have firearms in your house, make sure you follow all gun safety procedures. What's next? Visit your health care provider once a year for an annual wellness visit. Ask your health care provider how often you should have your eyes and teeth checked. Stay up to date on all vaccines. This information is not intended to replace advice given to you by your health care provider. Make sure you discuss any questions you have with your health care provider. Document Revised: 01/20/2021 Document Reviewed: 01/20/2021 Elsevier Patient Education  2023 Elsevier Inc.  

## 2022-06-27 ENCOUNTER — Ambulatory Visit (INDEPENDENT_AMBULATORY_CARE_PROVIDER_SITE_OTHER): Payer: Medicare Other | Admitting: Psychology

## 2022-06-27 DIAGNOSIS — F411 Generalized anxiety disorder: Secondary | ICD-10-CM | POA: Diagnosis not present
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
## 2022-07-04 ENCOUNTER — Ambulatory Visit: Payer: Medicare Other | Admitting: Psychology

## 2022-07-11 ENCOUNTER — Ambulatory Visit (INDEPENDENT_AMBULATORY_CARE_PROVIDER_SITE_OTHER): Payer: Medicare Other | Admitting: Psychology

## 2022-07-11 DIAGNOSIS — F411 Generalized anxiety disorder: Secondary | ICD-10-CM | POA: Diagnosis not present
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
## 2022-07-25 ENCOUNTER — Ambulatory Visit (INDEPENDENT_AMBULATORY_CARE_PROVIDER_SITE_OTHER): Payer: Medicare Other | Admitting: Psychology

## 2022-07-25 DIAGNOSIS — F411 Generalized anxiety disorder: Secondary | ICD-10-CM
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
## 2022-08-22 ENCOUNTER — Ambulatory Visit (INDEPENDENT_AMBULATORY_CARE_PROVIDER_SITE_OTHER): Payer: 59 | Admitting: Psychology

## 2022-08-22 DIAGNOSIS — F411 Generalized anxiety disorder: Secondary | ICD-10-CM
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
## 2022-08-25 ENCOUNTER — Other Ambulatory Visit: Payer: Self-pay

## 2022-08-25 DIAGNOSIS — F411 Generalized anxiety disorder: Secondary | ICD-10-CM

## 2022-08-25 DIAGNOSIS — I10 Essential (primary) hypertension: Secondary | ICD-10-CM

## 2022-08-25 MED ORDER — CARVEDILOL 3.125 MG PO TABS: 3.1250 mg | ORAL_TABLET | Freq: Two times a day (BID) | ORAL | 1 refills | Status: DC

## 2022-08-25 MED ORDER — DULOXETINE HCL 20 MG PO CPEP: 20.0000 mg | ORAL_CAPSULE | Freq: Every day | ORAL | 1 refills | Status: DC

## 2022-09-05 ENCOUNTER — Ambulatory Visit (INDEPENDENT_AMBULATORY_CARE_PROVIDER_SITE_OTHER): Payer: 59 | Admitting: Psychology

## 2022-09-05 DIAGNOSIS — F411 Generalized anxiety disorder: Secondary | ICD-10-CM
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
## 2022-09-19 ENCOUNTER — Ambulatory Visit (INDEPENDENT_AMBULATORY_CARE_PROVIDER_SITE_OTHER): Payer: 59 | Admitting: Psychology

## 2022-09-19 DIAGNOSIS — F411 Generalized anxiety disorder: Secondary | ICD-10-CM
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
## 2022-10-03 ENCOUNTER — Ambulatory Visit (INDEPENDENT_AMBULATORY_CARE_PROVIDER_SITE_OTHER): Payer: 59 | Admitting: Psychology

## 2022-10-03 DIAGNOSIS — F411 Generalized anxiety disorder: Secondary | ICD-10-CM | POA: Diagnosis not present
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
## 2022-10-17 ENCOUNTER — Ambulatory Visit (INDEPENDENT_AMBULATORY_CARE_PROVIDER_SITE_OTHER): Payer: 59 | Admitting: Psychology

## 2022-10-17 DIAGNOSIS — F411 Generalized anxiety disorder: Secondary | ICD-10-CM | POA: Diagnosis not present
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
## 2022-10-31 ENCOUNTER — Ambulatory Visit (INDEPENDENT_AMBULATORY_CARE_PROVIDER_SITE_OTHER): Payer: 59 | Admitting: Psychology

## 2022-10-31 DIAGNOSIS — F411 Generalized anxiety disorder: Secondary | ICD-10-CM | POA: Diagnosis not present
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
## 2022-11-14 ENCOUNTER — Ambulatory Visit (INDEPENDENT_AMBULATORY_CARE_PROVIDER_SITE_OTHER): Payer: 59 | Admitting: Psychology

## 2022-11-14 DIAGNOSIS — F411 Generalized anxiety disorder: Secondary | ICD-10-CM
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
## 2022-11-22 DIAGNOSIS — R7303 Prediabetes: Secondary | ICD-10-CM | POA: Diagnosis not present

## 2022-11-22 DIAGNOSIS — Z79899 Other long term (current) drug therapy: Secondary | ICD-10-CM | POA: Diagnosis not present

## 2022-11-22 DIAGNOSIS — I10 Essential (primary) hypertension: Secondary | ICD-10-CM | POA: Diagnosis not present

## 2022-11-22 DIAGNOSIS — Z0000 Encounter for general adult medical examination without abnormal findings: Secondary | ICD-10-CM | POA: Diagnosis not present

## 2022-11-22 DIAGNOSIS — Z720 Tobacco use: Secondary | ICD-10-CM | POA: Diagnosis not present

## 2022-11-22 DIAGNOSIS — Z1159 Encounter for screening for other viral diseases: Secondary | ICD-10-CM | POA: Diagnosis not present

## 2022-11-22 DIAGNOSIS — G43909 Migraine, unspecified, not intractable, without status migrainosus: Secondary | ICD-10-CM | POA: Diagnosis not present

## 2022-11-22 DIAGNOSIS — E559 Vitamin D deficiency, unspecified: Secondary | ICD-10-CM | POA: Diagnosis not present

## 2022-11-28 ENCOUNTER — Ambulatory Visit: Payer: Medicare Other | Admitting: Psychology

## 2022-12-06 DIAGNOSIS — Z0000 Encounter for general adult medical examination without abnormal findings: Secondary | ICD-10-CM | POA: Diagnosis not present

## 2022-12-06 DIAGNOSIS — Z79899 Other long term (current) drug therapy: Secondary | ICD-10-CM | POA: Diagnosis not present

## 2022-12-06 DIAGNOSIS — E785 Hyperlipidemia, unspecified: Secondary | ICD-10-CM | POA: Diagnosis not present

## 2022-12-06 DIAGNOSIS — Z0001 Encounter for general adult medical examination with abnormal findings: Secondary | ICD-10-CM | POA: Diagnosis not present

## 2022-12-12 ENCOUNTER — Ambulatory Visit: Payer: Medicare Other | Admitting: Psychology

## 2022-12-22 ENCOUNTER — Ambulatory Visit: Payer: 59 | Admitting: Family Medicine

## 2022-12-26 ENCOUNTER — Ambulatory Visit (INDEPENDENT_AMBULATORY_CARE_PROVIDER_SITE_OTHER): Payer: 59 | Admitting: Psychology

## 2022-12-26 DIAGNOSIS — F411 Generalized anxiety disorder: Secondary | ICD-10-CM
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
## 2022-12-29 LAB — COLOGUARD: COLOGUARD: NEGATIVE

## 2023-01-09 ENCOUNTER — Ambulatory Visit (INDEPENDENT_AMBULATORY_CARE_PROVIDER_SITE_OTHER): Payer: 59 | Admitting: Psychology

## 2023-01-09 DIAGNOSIS — F411 Generalized anxiety disorder: Secondary | ICD-10-CM | POA: Diagnosis not present
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
## 2023-01-23 ENCOUNTER — Ambulatory Visit (INDEPENDENT_AMBULATORY_CARE_PROVIDER_SITE_OTHER): Payer: 59 | Admitting: Psychology

## 2023-01-23 DIAGNOSIS — F411 Generalized anxiety disorder: Secondary | ICD-10-CM
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
## 2023-02-06 ENCOUNTER — Ambulatory Visit (INDEPENDENT_AMBULATORY_CARE_PROVIDER_SITE_OTHER): Payer: 59 | Admitting: Psychology

## 2023-02-06 DIAGNOSIS — F3341 Major depressive disorder, recurrent, in partial remission: Secondary | ICD-10-CM | POA: Diagnosis not present

## 2023-02-06 DIAGNOSIS — F411 Generalized anxiety disorder: Secondary | ICD-10-CM
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
## 2023-02-20 ENCOUNTER — Ambulatory Visit: Payer: 59 | Admitting: Psychology

## 2023-02-23 ENCOUNTER — Other Ambulatory Visit: Payer: Self-pay | Admitting: Nurse Practitioner

## 2023-02-23 DIAGNOSIS — I10 Essential (primary) hypertension: Secondary | ICD-10-CM

## 2023-02-23 DIAGNOSIS — F411 Generalized anxiety disorder: Secondary | ICD-10-CM

## 2023-03-02 ENCOUNTER — Ambulatory Visit: Payer: 59 | Admitting: Family Medicine

## 2023-03-02 NOTE — Progress Notes (Deleted)
   Established Patient Office Visit  Subjective   Patient ID: Amanda [name], female    DOB: Oct 16, 1950  Age: 72 y.o. MRN: [id number]  No chief complaint on file.   HPI  HTN  Mood   The 10-year ASCVD risk score (Arnett DK, et al., 2019) is: 30.6%   {History (Optional):23778}  ROS    Objective:     There were no vitals taken for this visit. {Vitals History (Optional):23777}  Physical Exam   No results found for any visits on 03/02/23.  {Labs (Optional):23779}      Assessment & Plan:   There are no diagnoses linked to this encounter.   No follow-ups on file.    Sandre Kitty, MD

## 2023-03-06 ENCOUNTER — Ambulatory Visit (INDEPENDENT_AMBULATORY_CARE_PROVIDER_SITE_OTHER): Payer: 59 | Admitting: Psychology

## 2023-03-06 DIAGNOSIS — F411 Generalized anxiety disorder: Secondary | ICD-10-CM | POA: Diagnosis not present

## 2023-03-06 DIAGNOSIS — F3341 Major depressive disorder, recurrent, in partial remission: Secondary | ICD-10-CM | POA: Diagnosis not present
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
## 2023-03-09 ENCOUNTER — Ambulatory Visit (INDEPENDENT_AMBULATORY_CARE_PROVIDER_SITE_OTHER): Payer: 59 | Admitting: Family Medicine

## 2023-03-09 ENCOUNTER — Encounter: Payer: Self-pay | Admitting: Family Medicine

## 2023-03-09 VITALS — BP 133/86 | HR 67 | Ht 66.0 in | Wt 153.4 lb

## 2023-03-09 DIAGNOSIS — I10 Essential (primary) hypertension: Secondary | ICD-10-CM | POA: Diagnosis not present

## 2023-03-09 DIAGNOSIS — E7800 Pure hypercholesterolemia, unspecified: Secondary | ICD-10-CM

## 2023-03-09 DIAGNOSIS — F411 Generalized anxiety disorder: Secondary | ICD-10-CM | POA: Diagnosis not present

## 2023-03-09 DIAGNOSIS — R7303 Prediabetes: Secondary | ICD-10-CM

## 2023-03-09 NOTE — Patient Instructions (Addendum)
It was nice to see you today,  We addressed the following topics today: -Your tick bite site is healing well.  I would not be concerned about it. - I will order a DEXA scan.  Someone from [facility] should call you to schedule - You can schedule an appointment with [place] to have the seborrheic keratosis removed from your back - You had a negative Cologuard reading from May - I will let you know the results of your lab work and we will get them.  Have a great day,  Frederic Jericho, MD

## 2023-03-09 NOTE — Assessment & Plan Note (Signed)
Worsened anxiety in July after the death of her dog, raphe, that she has had for 11 years.  Takes duloxetine.  Uses calm app.  Has a therapist, Dr. Felipa Furnace. Continue to monitor

## 2023-03-09 NOTE — Assessment & Plan Note (Signed)
Patient states she has tried multiple statins in the past and did not tolerate them.  Does not want to try alternative nonstatin cholesterol medications.  Is taking a "natural" product.  Advised patient to call back with the name of the product so that we can added to her list - Lipid panel - Continue to encourage statin or cholesterol medication use.

## 2023-03-09 NOTE — Progress Notes (Signed)
   Established Patient Office Visit  Subjective   Patient ID: Amanda [name], female    DOB: 1951/06/17  Age: 72 y.o. MRN: [id number]  Chief Complaint  Patient presents with   Medical Management of Chronic Issues    HPI  HTN -patient is taking her carvedilol.  Tolerating it well.  No concerns.  Anxiety-patient states 03-19-23 was a rough month for her.  Her dog, raphe, died.  She had had the dog for 11 years.   She also has a another Rottweiler puppy named [place].  She is a Museum/gallery conservator.  She sees a therapist, Dr. Katheran Awe.  She also uses the calm app.  Finds the calm app to be very helpful for her anxiety.  She takes duloxetine.  We discussed the patient's negative Cologuard  Patient was bitten by a tick last week on the back.  Does not hurt currently.  We discussed possibly getting a DEXA scan to evaluate for osteoporosis.  Patient okay with this.  HLD-patient is taking a "natural medicine" but she does not know the name of it.  She will let us know the name when she finds it.  She has tried statins in the past and they do not sit well with her.  We discussed alternatives to statins that are also pharmacological medications.  Patient declines medication at this time.   The 10-year ASCVD risk score (Arnett DK, et al., 2019) is: 29.1%     ROS    Objective:     BP 133/86   Pulse 67   Ht 5\' 6"  (1.676 m)   Wt 153 lb 6.4 oz (69.6 kg)   SpO2 96%   BMI 24.76 kg/m    Physical Exam General: Alert and oriented CV: Regular rate and rhythm Pulmonary: Lungs clear Skin: Small healing bite on the left trapezius Psych: Pleasant affect.  Spontaneous speech.  Good eye contact.  Does not appear overly anxious.   No results found for any visits on 03/09/23.        Assessment & Plan:   Primary hypertension Assessment & Plan: No issues with carvedilol.  Continue current medication.   Elevated LDL cholesterol level Assessment & Plan: Patient states she has  tried multiple statins in the past and did not tolerate them.  Does not want to try alternative nonstatin cholesterol medications.  Is taking a "natural" product.  Advised patient to call back with the name of the product so that we can added to her list - Lipid panel - Continue to encourage statin or cholesterol medication use.  Orders: -     Lipid panel  Prediabetes -     Hemoglobin A1c  GAD (generalized anxiety disorder) Assessment & Plan: Worsened anxiety in 03-19-23 after the death of her dog, raphe, that she has had for 11 years.  Takes duloxetine.  Uses calm app.  Has a therapist, Dr. Felipa Furnace. Continue to monitor      Return in about 6 months (around 09/09/2023) for HTN.    Sandre Kitty, MD

## 2023-03-09 NOTE — Assessment & Plan Note (Signed)
No issues with carvedilol.  Continue current medication.

## 2023-03-13 ENCOUNTER — Other Ambulatory Visit: Payer: Self-pay | Admitting: Family Medicine

## 2023-03-13 ENCOUNTER — Telehealth: Payer: Self-pay

## 2023-03-13 NOTE — Telephone Encounter (Signed)
Pt calling to report the name of the Cholesterol medication which was [facility] - Eat Cholesterol Formula.

## 2023-03-20 ENCOUNTER — Ambulatory Visit: Payer: 59 | Admitting: Psychology

## 2023-03-20 DIAGNOSIS — F411 Generalized anxiety disorder: Secondary | ICD-10-CM

## 2023-03-20 DIAGNOSIS — F3341 Major depressive disorder, recurrent, in partial remission: Secondary | ICD-10-CM | POA: Diagnosis not present
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
## 2023-04-03 ENCOUNTER — Ambulatory Visit: Payer: 59 | Admitting: Psychology

## 2023-04-03 DIAGNOSIS — F3341 Major depressive disorder, recurrent, in partial remission: Secondary | ICD-10-CM

## 2023-04-03 DIAGNOSIS — F411 Generalized anxiety disorder: Secondary | ICD-10-CM
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
## 2023-04-17 ENCOUNTER — Ambulatory Visit (INDEPENDENT_AMBULATORY_CARE_PROVIDER_SITE_OTHER): Payer: 59 | Admitting: Psychology

## 2023-04-17 DIAGNOSIS — F3341 Major depressive disorder, recurrent, in partial remission: Secondary | ICD-10-CM | POA: Diagnosis not present

## 2023-04-17 DIAGNOSIS — F411 Generalized anxiety disorder: Secondary | ICD-10-CM | POA: Diagnosis not present
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
## 2023-05-01 ENCOUNTER — Ambulatory Visit (INDEPENDENT_AMBULATORY_CARE_PROVIDER_SITE_OTHER): Payer: 59 | Admitting: Psychology

## 2023-05-01 DIAGNOSIS — F411 Generalized anxiety disorder: Secondary | ICD-10-CM

## 2023-05-01 DIAGNOSIS — F3341 Major depressive disorder, recurrent, in partial remission: Secondary | ICD-10-CM | POA: Diagnosis not present
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
## 2023-05-09 ENCOUNTER — Ambulatory Visit: Payer: 59 | Admitting: Family Medicine

## 2023-05-09 NOTE — Progress Notes (Deleted)
   Established Patient Office Visit  Subjective   Patient ID: Amanda [name], female    DOB: Nov 06, 1950  Age: 72 y.o. MRN: [id number]  No chief complaint on file.   HPI   The 10-year ASCVD risk score (Arnett DK, et al., 2019) is: 28.9%  Health Maintenance Due  Topic Date Due   Pneumonia Vaccine 49+ Years old (1 of 2 - PCV) Never done   Hepatitis C Screening  Never done   Zoster Vaccines- Shingrix (1 of 2) Never done   DEXA SCAN  Never done   COVID-19 Vaccine (3 - Pfizer risk series) 11/27/2019   DTaP/Tdap/Td (2 - Td or Tdap) 08/08/2021   Fecal DNA (Cologuard)  10/23/2022   INFLUENZA VACCINE  Never done      Objective:     There were no vitals taken for this visit. {Vitals History (Optional):23777}  Physical Exam   No results found for any visits on 05/09/23.      Assessment & Plan:   There are no diagnoses linked to this encounter.   No follow-ups on file.    Sandre Kitty, MD

## 2023-05-15 ENCOUNTER — Ambulatory Visit (INDEPENDENT_AMBULATORY_CARE_PROVIDER_SITE_OTHER): Payer: 59 | Admitting: Psychology

## 2023-05-15 DIAGNOSIS — F3341 Major depressive disorder, recurrent, in partial remission: Secondary | ICD-10-CM | POA: Diagnosis not present

## 2023-05-15 DIAGNOSIS — F411 Generalized anxiety disorder: Secondary | ICD-10-CM
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
## 2023-05-23 ENCOUNTER — Encounter: Payer: Self-pay | Admitting: Family Medicine

## 2023-05-23 ENCOUNTER — Ambulatory Visit (INDEPENDENT_AMBULATORY_CARE_PROVIDER_SITE_OTHER): Payer: 59 | Admitting: Family Medicine

## 2023-05-23 VITALS — BP 129/74 | HR 63 | Ht 66.0 in | Wt 151.1 lb

## 2023-05-23 DIAGNOSIS — L821 Other seborrheic keratosis: Secondary | ICD-10-CM

## 2023-05-23 NOTE — Progress Notes (Signed)
Patient was here today for cryotherapy of a seborrheic keratoses on her left lateral torso.  Our liquid nitrogen was filled 4 days ago.  It has since evaporated due to a leak in the canister and we were unable to perform the cryotherapy treatment today.  We will need to coordinate the patient's appointment to occur on the same day as the cryo canister getting refilled until we were able to get a new canister.  No charge for visit

## 2023-05-29 ENCOUNTER — Ambulatory Visit (INDEPENDENT_AMBULATORY_CARE_PROVIDER_SITE_OTHER): Payer: 59 | Admitting: Psychology

## 2023-05-29 DIAGNOSIS — F411 Generalized anxiety disorder: Secondary | ICD-10-CM | POA: Diagnosis not present

## 2023-05-29 DIAGNOSIS — F3341 Major depressive disorder, recurrent, in partial remission: Secondary | ICD-10-CM
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
## 2023-06-12 ENCOUNTER — Ambulatory Visit (INDEPENDENT_AMBULATORY_CARE_PROVIDER_SITE_OTHER): Payer: 59 | Admitting: Psychology

## 2023-06-12 DIAGNOSIS — F3341 Major depressive disorder, recurrent, in partial remission: Secondary | ICD-10-CM | POA: Diagnosis not present

## 2023-06-12 DIAGNOSIS — F411 Generalized anxiety disorder: Secondary | ICD-10-CM | POA: Diagnosis not present
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
## 2023-07-10 ENCOUNTER — Ambulatory Visit (INDEPENDENT_AMBULATORY_CARE_PROVIDER_SITE_OTHER): Payer: 59 | Admitting: Psychology

## 2023-07-10 ENCOUNTER — Ambulatory Visit: Payer: Self-pay | Admitting: Family Medicine

## 2023-07-10 ENCOUNTER — Telehealth: Payer: 59 | Admitting: Physician Assistant

## 2023-07-10 DIAGNOSIS — F411 Generalized anxiety disorder: Secondary | ICD-10-CM

## 2023-07-10 DIAGNOSIS — Z91199 Patient's noncompliance with other medical treatment and regimen due to unspecified reason: Secondary | ICD-10-CM

## 2023-07-10 DIAGNOSIS — F3341 Major depressive disorder, recurrent, in partial remission: Secondary | ICD-10-CM | POA: Diagnosis not present
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
## 2023-07-10 NOTE — Telephone Encounter (Signed)
Copied from CRM [id number]. Topic: Clinical - Red Word Triage >> Jul 10, 2023  8:38 AM Dondra Prader A wrote: Red Word that prompted transfer to Nurse Triage: Pt states she is having nasal congestion, pressure in ears, and temp 100.1   Chief Complaint: Sinus congestion Symptoms: dry cough and fullness in ears, no taste Frequency: started on Thursday Pertinent Negatives: Patient denies fever in the past 24 hours Disposition: [] ED /[] Urgent Care (no appt availability in office) / [] Appointment(In office/virtual)/ [x]  [facility]/ [] Home Care/ [] Refused Recommended Disposition /[] [facility]/ []  Follow-up with PCP Additional Notes: Patient reports symptoms improving with OTC medications, but reports uncomfortable pressure in ears along with greenish-yellow mucus when blowing nose. Virtual appt made for 07/10/23 at 10:30a,   Reason for Disposition  [1] Sinus congestion as part of a cold AND [2] present < 10 days  Answer Assessment - Initial Assessment Questions 1. LOCATION: "Where does it hurt?"      No pain  2. ONSET: "When did the sinus pain start?"  (e.g., hours, days)      Started last week on Thursday  3. SEVERITY: "How bad is the pain?"   (Scale 1-10; mild, moderate or severe)   - MILD (1-3): doesn't interfere with normal activities    - MODERATE (4-7): interferes with normal activities (e.g., work or school) or awakens from sleep   - SEVERE (8-10): excruciating pain and patient unable to do any normal activities        1- no pain, just very uncomfortable  4. RECURRENT SYMPTOM: "Have you ever had sinus problems before?" If Yes, ask: "When was the last time?" and "What happened that time?"      Reprts that she hasn't had a "head cold" before.  5. NASAL CONGESTION: "Is the nose blocked?" If Yes, ask: "Can you open it or must you breathe through your mouth?"     "Partially" dropped. Has been blowing nose alert. But reports lips are dry from mouth breathing  6. NASAL  DISCHARGE: "Do you have discharge from your nose?" If so ask, "What color?"     Started clear and now a yellowish-green color  7. FEVER: "Do you have a fever?" If Yes, ask: "What is it, how was it measured, and when did it start?"      Fever when all symptoms started, no fevers since Saturday  8. OTHER SYMPTOMS: "Do you have any other symptoms?" (e.g., sore throat, cough, earache, difficulty breathing)     Fever and pressure in ears, feels like there is fluid in them  9. PREGNANCY: "Is there any chance you are pregnant?" "When was your last menstrual period?"     No  Protocols used: Sinus Pain or Congestion-A-AH

## 2023-07-10 NOTE — Progress Notes (Signed)
The patient no-showed for appointment despite this provider sending direct link with no response and waiting for at least 10 minutes from appointment time for patient to join. They will be marked as a NS for this appointment/time.   Vestal Crandall Cody Axie Hayne, PA-C    

## 2023-07-20 ENCOUNTER — Encounter: Payer: Self-pay | Admitting: Family Medicine

## 2023-07-20 ENCOUNTER — Ambulatory Visit (INDEPENDENT_AMBULATORY_CARE_PROVIDER_SITE_OTHER): Payer: 59 | Admitting: Family Medicine

## 2023-07-20 VITALS — BP 142/91 | HR 70 | Wt 156.1 lb

## 2023-07-20 DIAGNOSIS — J988 Other specified respiratory disorders: Secondary | ICD-10-CM | POA: Insufficient documentation

## 2023-07-20 DIAGNOSIS — Z9081 Acquired absence of spleen: Secondary | ICD-10-CM | POA: Insufficient documentation

## 2023-07-20 NOTE — Patient Instructions (Signed)
It was nice to see you today,  We addressed the following topics today: Your symptoms are likely from a viral infection.  As long as they are continuing to get better there is nothing to do other than symptomatic treatment.  If it is getting worse we can try treating with antibiotics for sinusitis or bacterial respiratory infection. - You can take L thiamine over-the-counter 100 mg 1-2 times a day for anxiety. - For your respiratory symptoms: You can take nasal saline spray to break up nasal congestion.  You can also use Afrin for this.  For her ear congestion or pain you can use Flonase daily.  For sore throat you can use Cepacol lozenges.  For cough you can use medications that include guaifenesin such as Mucinex.  Have a great day,  Frederic Jericho, MD

## 2023-07-20 NOTE — Progress Notes (Signed)
   Acute Office Visit  Subjective:     Patient ID: Amanda [name], female    DOB: 12-Jun-1951, 72 y.o.   MRN: [id number]  Chief Complaint  Patient presents with   Nasal Congestion    HPI Patient is in today for common cold.  Patient states that since Thanksgiving she has had a "head cold".  States that for 10 days she mostly stayed in bed.  Symptoms include productive cough, nasal congestion, rhinorrhea, ear pain, headache.  No nausea vomiting diarrhea.  Highest temperature was 100.  Has been taking vitamin C, elderberry and honey, Aleve, NyQuil at night.  Sometimes wakes up coughing.  States that today feels like the first day that she has had any energy and has felt better.    ROS      Objective:    BP (!) 142/91   Pulse 70   Wt 156 lb 1.9 oz (70.8 kg)   SpO2 99%   BMI 25.20 kg/m    Physical Exam General: Alert complaining HEENT: Congested CV: Regular rhythm Pulmonary: Lungs clear bilaterally no wheeze or crackles.  No results found for any visits on 07/20/23.      Assessment & Plan:   Respiratory infection Assessment & Plan: Improving.  Symptoms were ongoing for over 2 weeks.  Advised her if symptoms worsen or if she develops fever let us know and we can treat with antibiotics.  Advised treat symptomatically as viral infection      Return if symptoms worsen or fail to improve.  Sandre Kitty, MD

## 2023-07-20 NOTE — Assessment & Plan Note (Signed)
Improving.  Symptoms were ongoing for over 2 weeks.  Advised her if symptoms worsen or if she develops fever let us know and we can treat with antibiotics.  Advised treat symptomatically as viral infection

## 2023-07-24 ENCOUNTER — Other Ambulatory Visit: Payer: Self-pay | Admitting: Family Medicine

## 2023-07-24 ENCOUNTER — Telehealth: Payer: Self-pay

## 2023-07-24 ENCOUNTER — Ambulatory Visit: Payer: 59 | Admitting: Psychology

## 2023-07-24 DIAGNOSIS — F411 Generalized anxiety disorder: Secondary | ICD-10-CM

## 2023-07-24 DIAGNOSIS — F3341 Major depressive disorder, recurrent, in partial remission: Secondary | ICD-10-CM | POA: Diagnosis not present

## 2023-07-24 MED ORDER — LEVOFLOXACIN 750 MG PO TABS: 750.0000 mg | ORAL_TABLET | Freq: Every day | ORAL | 0 refills | Status: DC

## 2023-07-24 NOTE — Telephone Encounter (Signed)
Copied from CRM [id number]. Topic: Clinical - Medication Question >> Jul 24, 2023 10:01 AM Donita Brooks wrote: Reason for CRM: pt is stating that virus is starting back up and was told by provider that he will send in some antibiotics if that happens

## 2023-07-24 NOTE — Telephone Encounter (Signed)
LVM requesting a return call.

## 2023-07-24 NOTE — Telephone Encounter (Signed)
Please let the patient know I sent in a prescription for 5 days of Levaquin to be taken once a day.
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
## 2023-08-07 ENCOUNTER — Other Ambulatory Visit: Payer: Self-pay | Admitting: Family Medicine

## 2023-08-07 ENCOUNTER — Ambulatory Visit: Payer: 59 | Admitting: Psychology

## 2023-08-07 DIAGNOSIS — F411 Generalized anxiety disorder: Secondary | ICD-10-CM

## 2023-08-21 ENCOUNTER — Ambulatory Visit: Payer: 59 | Admitting: Psychology

## 2023-08-21 DIAGNOSIS — F3341 Major depressive disorder, recurrent, in partial remission: Secondary | ICD-10-CM

## 2023-08-21 DIAGNOSIS — F411 Generalized anxiety disorder: Secondary | ICD-10-CM | POA: Diagnosis not present
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
## 2023-09-04 ENCOUNTER — Ambulatory Visit: Payer: 59 | Admitting: Psychology

## 2023-09-04 DIAGNOSIS — F411 Generalized anxiety disorder: Secondary | ICD-10-CM | POA: Diagnosis not present

## 2023-09-04 DIAGNOSIS — F3341 Major depressive disorder, recurrent, in partial remission: Secondary | ICD-10-CM | POA: Diagnosis not present
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
## 2023-09-11 ENCOUNTER — Telehealth: Payer: Self-pay

## 2023-09-11 ENCOUNTER — Ambulatory Visit (INDEPENDENT_AMBULATORY_CARE_PROVIDER_SITE_OTHER): Payer: 59 | Admitting: Family Medicine

## 2023-09-11 ENCOUNTER — Encounter: Payer: Self-pay | Admitting: Family Medicine

## 2023-09-11 VITALS — BP 157/97 | HR 68 | Ht 66.0 in | Wt 155.1 lb

## 2023-09-11 DIAGNOSIS — Z13820 Encounter for screening for osteoporosis: Secondary | ICD-10-CM

## 2023-09-11 DIAGNOSIS — F411 Generalized anxiety disorder: Secondary | ICD-10-CM | POA: Diagnosis not present

## 2023-09-11 DIAGNOSIS — I10 Essential (primary) hypertension: Secondary | ICD-10-CM

## 2023-09-11 DIAGNOSIS — L821 Other seborrheic keratosis: Secondary | ICD-10-CM | POA: Insufficient documentation

## 2023-09-11 NOTE — Telephone Encounter (Signed)
Copied from CRM [id number]. Topic: General - Other >> Sep 11, 2023  1:19 PM Prudencio Pair wrote: Reason for CRM: Patient states she was advised to call back and let her pcp know the name of the supplement that she's taking. It is called Garlique. She also provided her [employer], which I confirmed that it's already in the system.

## 2023-09-11 NOTE — Assessment & Plan Note (Addendum)
Still following with her therapist, Dr. Felipa Furnace.  Still taking duloxetine.  No questions or concerns.

## 2023-09-11 NOTE — Assessment & Plan Note (Signed)
Elevated today.  Only on carvedilol.  Will have patient recheck at home and bring in values with her to her next visit in 1 month.  If continued to be elevated and elevated at home will start an additional medication if needed.

## 2023-09-11 NOTE — Assessment & Plan Note (Signed)
Single SK located on the lateral left side of the abdomen.  Cryotherapy with liquid nitrogen was used today.  Will reassess at next visit.

## 2023-09-11 NOTE — Progress Notes (Unsigned)
Cryotherapy Procedure Note  Pre-operative Diagnosis: seborrheic keratosis  Post-operative Diagnosis: same  Locations: left trunk  Indications: bothersome, pruritic  Anesthesia: not required     Procedure Details  History of allergy to iodine: no. Pacemaker? no.  Patient informed of risks (permanent scarring, infection, light or dark discoloration, bleeding, infection, weakness, numbness and recurrence of the lesion) and benefits of the procedure and verbal informed consent obtained.  The areas are treated with liquid nitrogen therapy, frozen until ice ball extended 1 mm beyond lesion, allowed to thaw, and treated again. The patient tolerated procedure well.  The patient was instructed on post-op care, warned that there may be blister formation, redness and pain. Recommend OTC analgesia as needed for pain.  Condition: Stable  Complications: none.  Plan: 1. Instructed to keep the area dry and covered for 24-48h and clean thereafter. 2. Warning signs of infection were reviewed.   3. Recommended that the patient use OTC acetaminophen as needed for pain.  4. Return in 1 month.

## 2023-09-11 NOTE — Progress Notes (Unsigned)
   Established Patient Office Visit  Subjective   Patient ID: Amanda [name], female    DOB: 01-22-1951  Age: 73 y.o. MRN: [id number]  Chief Complaint  Patient presents with   Medical Management of Chronic Issues    HPI SK.-Patient agreeable to having cryotherapy on the seborrheic keratosis on her left lateral torso.  Discussed risks associated with cryotherapy, what to look for after the procedure.  Reasons to follow-up with me.  Patient still taking duloxetine, still seeing Dr. Felipa Furnace.  Currently her sister is remodeling the house and the mess is worsening her anxiety.  Still has her Rottweiler puppy and a rat terrier.  She also recently had some issues with using [employer]u in which they locked her out of her own credit card account.  Is going to the bank afterwards to get this fixed.  Patient still cannot remember the name of her over-the-counter medication for cholesterol that she was taking.  Does take co-Q10.  Discussed rechecking cholesterol level is again in 6 months.  Hypertension-patient taking the carvedilol.  No questions or concerns.  Discussed rechecking her blood pressure at home for the next few weeks and following up with [place] again in 1 month.  Osteoporosis-we discussed getting the DEXA scan.  Sent that in today.   The 10-year ASCVD risk score (Arnett DK, et al., 2019) is: 38%  Health Maintenance Due  Topic Date Due   Pneumonia Vaccine 83+ Years old (1 of 2 - PCV) Never done   Hepatitis C Screening  Never done   Zoster Vaccines- Shingrix (1 of 2) Never done   DEXA SCAN  Never done   COVID-19 Vaccine (3 - Pfizer risk series) 11/27/2019   DTaP/Tdap/Td (2 - Td or Tdap) 08/08/2021      Objective:     BP (!) 157/97   Pulse 68   Ht 5\' 6"  (1.676 m)   Wt 155 lb 1.9 oz (70.4 kg)   SpO2 99%   BMI 25.04 kg/m    Physical Exam General: Alert, oriented CV: Regular Pulmonary: Skin: Thick seborrheic keratosis approximately 1 cm in diameter on the left lateral torso  at the level of the umbilicus.   No results found for any visits on 09/11/23.      Assessment & Plan:   Primary hypertension Assessment & Plan: Elevated today.  Only on carvedilol.  Will have patient recheck at home and bring in values with her to her next visit in 1 month.  If continued to be elevated and elevated at home will start an additional medication if needed.   Osteoporosis screening -     DG Bone Density; Future  GAD (generalized anxiety disorder) Assessment & Plan: Still following with her therapist, Dr. Felipa Furnace.  Still taking duloxetine.  No questions or concerns.   Seborrheic keratosis Assessment & Plan: Single SK located on the lateral left side of the abdomen.  Cryotherapy with liquid nitrogen was used today.  Will reassess at next visit.      Return in about 4 weeks (around 10/09/2023) for HTN.    Sandre Kitty, MD

## 2023-09-11 NOTE — Patient Instructions (Addendum)
It was nice to see you today,  We addressed the following topics today: -If you are concerned about the SK or the cryotherapy and wants to look at it again let us know.  You can schedule appointment to have a recheck it.  If it does not fall off on its own let us know and we can retry the cryotherapy. --Her blood pressure was elevated both times we checked it.  I would like you to check it at home, document the values and when you see me again in 1 month we can discuss the values.  Have a great day,  Frederic Jericho, MD

## 2023-09-18 ENCOUNTER — Ambulatory Visit: Payer: 59 | Admitting: Psychology

## 2023-10-02 ENCOUNTER — Ambulatory Visit (INDEPENDENT_AMBULATORY_CARE_PROVIDER_SITE_OTHER): Payer: 59 | Admitting: Psychology

## 2023-10-02 DIAGNOSIS — F3341 Major depressive disorder, recurrent, in partial remission: Secondary | ICD-10-CM

## 2023-10-02 DIAGNOSIS — F411 Generalized anxiety disorder: Secondary | ICD-10-CM

## 2023-10-02 DIAGNOSIS — F330 Major depressive disorder, recurrent, mild: Secondary | ICD-10-CM
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
## 2023-10-10 ENCOUNTER — Ambulatory Visit (INDEPENDENT_AMBULATORY_CARE_PROVIDER_SITE_OTHER): Payer: 59 | Admitting: Family Medicine

## 2023-10-10 ENCOUNTER — Encounter: Payer: Self-pay | Admitting: Family Medicine

## 2023-10-10 VITALS — BP 152/93 | HR 69 | Ht 66.0 in | Wt 156.0 lb

## 2023-10-10 DIAGNOSIS — L821 Other seborrheic keratosis: Secondary | ICD-10-CM | POA: Diagnosis not present

## 2023-10-10 DIAGNOSIS — I10 Essential (primary) hypertension: Secondary | ICD-10-CM | POA: Diagnosis not present

## 2023-10-10 DIAGNOSIS — R12 Heartburn: Secondary | ICD-10-CM | POA: Diagnosis not present

## 2023-10-10 DIAGNOSIS — F411 Generalized anxiety disorder: Secondary | ICD-10-CM | POA: Diagnosis not present

## 2023-10-10 MED ORDER — CARVEDILOL 6.25 MG PO TABS
6.2500 mg | ORAL_TABLET | Freq: Two times a day (BID) | ORAL | 2 refills | Status: DC
Start: 2023-10-10 — End: 2023-11-14

## 2023-10-10 MED ORDER — DULOXETINE HCL 20 MG PO CPEP: 20.0000 mg | ORAL_CAPSULE | Freq: Two times a day (BID) | ORAL | 2 refills | Status: DC

## 2023-10-10 NOTE — Assessment & Plan Note (Signed)
 Cryotherapy was used again.  Seborrheic keratosis still present on last time.  Likely not effective because of the thickness of the SK.  Will try again today.  If it does not work this time we will then try shave excision and if necessary repeat acryotherapy after it has healed.  Patient tolerated procedure well.  Liquid nitrogen applied for approximately 15 seconds and then repeated.

## 2023-10-10 NOTE — Assessment & Plan Note (Signed)
 Associated with environmental stress.  Recommended over-the-counter Tums as needed.  Advised her to use Pepcid daily if Tums not effective.

## 2023-10-10 NOTE — Patient Instructions (Signed)
 It was nice to see you today,  We addressed the following topics today: -I am increasing your carvedilol to 6.25 twice daily - I am increasing your duloxetine to twice daily. - Continue taking your blood pressure and bring in the readings next time. - If you not tolerate either these changes you can go back to the original dosing.   Have a great day,  Frederic Jericho, MD
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
## 2023-10-10 NOTE — Assessment & Plan Note (Signed)
 Patient agreeable to increasing her duloxetine 20 mg to twice a day.  Advised her if she does not tolerate the change she can go back to taking them once a day and we can adjust the medicine again at her next visit

## 2023-10-10 NOTE — Assessment & Plan Note (Signed)
 Almost all patient's readings are above 130/85.  Some of them in the 120s but not on lower than that.  Heart rates generally in the 60s to 70s.  She is agreeable to increasing her carvedilol dose.  Advised her to continue checking her blood pressure and heart rate over the next month.

## 2023-10-16 ENCOUNTER — Ambulatory Visit: Payer: 59 | Admitting: Psychology

## 2023-10-16 DIAGNOSIS — F3341 Major depressive disorder, recurrent, in partial remission: Secondary | ICD-10-CM

## 2023-10-16 DIAGNOSIS — F411 Generalized anxiety disorder: Secondary | ICD-10-CM | POA: Diagnosis not present
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
## 2023-10-20 ENCOUNTER — Ambulatory Visit: Payer: Self-pay | Admitting: Family Medicine

## 2023-10-20 NOTE — Telephone Encounter (Signed)
 1st attempt, this RN LVM requesting pt return call to office.  Copied from CRM [id number]. Topic: Clinical - Pink Word Triage >> Oct 20, 2023  9:38 AM Amanda [name] wrote: Reason for Triage: Bumps on face, hands red, head itching. Maybe do to increase in dose to heart medication.

## 2023-10-20 NOTE — Telephone Encounter (Signed)
  Chief Complaint: rash Symptoms: rash small pimples on face and head, itching, palms of hands red x 1 hr  Frequency: Monday  Pertinent Negatives: Patient denies pain Disposition: [] ED /[] Urgent Care (no appt availability in office) / [] Appointment(In office/virtual)/ []  [facility]/ [] Home Care/ [] Refused Recommended Disposition /[] [facility]/ [x]  Follow-up with PCP Additional Notes: pt had OV on 10/10/23, had carvedilol increased and duloxetine. Pt feels like since she hasn't changed anything body care wise that rash is coming from carvedilol and asking if she can take half dose BID or every day which every she feels PCP is ok with. She also states after taking the carvedilol her palms turn pinkish red for about a hour after taking 1 hour later. Advised pt since she recently had OV would send message to PCP for recommendations and have nurse FU with her. Pt verbalized understanding.   Reason for Disposition  Localized rash present > 7 days  Answer Assessment - Initial Assessment Questions 1. APPEARANCE of RASH: "Describe the rash."      Small pimples  2. LOCATION: "Where is the rash located?"      Face and head  5. ONSET: "When did the rash start?"      Monday  6. ITCHING: "Does the rash itch?" If Yes, ask: "How bad is the itch?"  (Scale 0-10; or none, mild, moderate, severe)     Mild to moderate  7. PAIN: "Does the rash hurt?" If Yes, ask: "How bad is the pain?"  (Scale 0-10; or none, mild, moderate, severe)    - NONE (0): no pain    - MILD (1-3): doesn't interfere with normal activities     - MODERATE (4-7): interferes with normal activities or awakens from sleep     - SEVERE (8-10): excruciating pain, unable to do any normal activities     no 8. OTHER SYMPTOMS: "Do you have any other symptoms?" (e.g., fever)  Protocols used: Rash or Redness - Localized-A-AH

## 2023-10-20 NOTE — Telephone Encounter (Signed)
 Patient called back and disconnected before agent could transfer call to this Clinical research associate. Attempted to return call to patient but received voicemail, left message to return call.

## 2023-10-23 NOTE — Telephone Encounter (Signed)
 Please call patient and let her know that she can go back to the previous dosing of her carvedilol by cutting the pills in half.  Neither of these medications should be causing the rash on her face if she previously tolerated lower doses without issue.  It does not sound like an allergy to the medicine, but without being able to see and evaluate the rash I cannot say for sure.  If she is still having concerns with the rash she can schedule an appointment so I can evaluate her.

## 2023-10-23 NOTE — Telephone Encounter (Signed)
 Contacted pt and informed her of below and she said that the rash is gone, she said she did not take the meds for 2 days and yesterday she started the 1/2.

## 2023-10-30 ENCOUNTER — Ambulatory Visit (INDEPENDENT_AMBULATORY_CARE_PROVIDER_SITE_OTHER): Payer: 59 | Admitting: Psychology

## 2023-10-30 DIAGNOSIS — F3341 Major depressive disorder, recurrent, in partial remission: Secondary | ICD-10-CM

## 2023-10-30 DIAGNOSIS — F411 Generalized anxiety disorder: Secondary | ICD-10-CM

## 2023-10-30 DIAGNOSIS — F330 Major depressive disorder, recurrent, mild: Secondary | ICD-10-CM
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
## 2023-11-13 ENCOUNTER — Ambulatory Visit (INDEPENDENT_AMBULATORY_CARE_PROVIDER_SITE_OTHER): Payer: 59 | Admitting: Psychology

## 2023-11-13 DIAGNOSIS — F411 Generalized anxiety disorder: Secondary | ICD-10-CM | POA: Diagnosis not present

## 2023-11-13 DIAGNOSIS — F3341 Major depressive disorder, recurrent, in partial remission: Secondary | ICD-10-CM
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
## 2023-11-13 NOTE — Progress Notes (Unsigned)
   Established Patient Office Visit  Subjective   Patient ID: Amanda [name], female    DOB: 01-Jan-1951  Age: 73 y.o. MRN: [id number]  No chief complaint on file.   HPI  SK   Htn - increaed carvedilol  Gad - increase duloxetine to 20mg  bid   The 10-year ASCVD risk score (Arnett DK, et al., 2019) is: 36.1%  Health Maintenance Due  Topic Date Due   Pneumonia Vaccine 4+ Years old (1 of 2 - PCV) Never done   Hepatitis C Screening  Never done   Zoster Vaccines- Shingrix (1 of 2) Never done   DEXA SCAN  Never done   COVID-19 Vaccine (3 - Pfizer risk series) 11/27/2019   DTaP/Tdap/Td (2 - Td or Tdap) 08/08/2021   Medicare Annual Wellness (AWV)  12/06/2023      Objective:     There were no vitals taken for this visit. {Vitals History (Optional):23777}  Physical Exam   No results found for any visits on 11/14/23.      Assessment & Plan:   There are no diagnoses linked to this encounter.   No follow-ups on file.    Sandre Kitty, MD

## 2023-11-14 ENCOUNTER — Ambulatory Visit (INDEPENDENT_AMBULATORY_CARE_PROVIDER_SITE_OTHER): Admitting: Family Medicine

## 2023-11-14 ENCOUNTER — Encounter: Payer: Self-pay | Admitting: Family Medicine

## 2023-11-14 VITALS — BP 144/85 | HR 59 | Ht 66.0 in | Wt 153.1 lb

## 2023-11-14 DIAGNOSIS — I10 Essential (primary) hypertension: Secondary | ICD-10-CM | POA: Diagnosis not present

## 2023-11-14 DIAGNOSIS — F411 Generalized anxiety disorder: Secondary | ICD-10-CM

## 2023-11-14 DIAGNOSIS — L821 Other seborrheic keratosis: Secondary | ICD-10-CM | POA: Diagnosis not present

## 2023-11-14 MED ORDER — CARVEDILOL 6.25 MG PO TABS
6.2500 mg | ORAL_TABLET | Freq: Two times a day (BID) | ORAL | 3 refills | Status: DC
Start: 2023-11-14 — End: 2024-03-14

## 2023-11-14 NOTE — Assessment & Plan Note (Signed)
 Continue carvedilol 6.25mg  bid.  Home bp readings all < 140 systolic and < 90 diastolic. On average around 130/80

## 2023-11-14 NOTE — Assessment & Plan Note (Signed)
 2x cryotherapy treatment and lesion still present.  If pt desires we will perform shave biopsy of the lesion.

## 2023-11-14 NOTE — Patient Instructions (Signed)
 It was nice to see you today,  We addressed the following topics today: I will send in a refill of your carvedilol. - You can schedule a visit with me to remove the SK and also check for memory issues.  This can be anytime between now and when I follow-up with you again in 6 months  Have a great day,  Frederic Jericho, MD

## 2023-11-14 NOTE — Assessment & Plan Note (Signed)
 Improved with bid duloxetine dosing and removing stressful job from her life.  Continue with duloxetine.  F/u 6 months

## 2023-11-27 ENCOUNTER — Ambulatory Visit: Payer: 59 | Admitting: Psychology

## 2023-12-11 ENCOUNTER — Ambulatory Visit (INDEPENDENT_AMBULATORY_CARE_PROVIDER_SITE_OTHER): Payer: 59 | Admitting: Psychology

## 2023-12-11 DIAGNOSIS — F411 Generalized anxiety disorder: Secondary | ICD-10-CM | POA: Diagnosis not present

## 2023-12-11 DIAGNOSIS — F3341 Major depressive disorder, recurrent, in partial remission: Secondary | ICD-10-CM | POA: Diagnosis not present
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
## 2023-12-25 ENCOUNTER — Ambulatory Visit: Payer: 59 | Admitting: Psychology

## 2023-12-25 DIAGNOSIS — F411 Generalized anxiety disorder: Secondary | ICD-10-CM | POA: Diagnosis not present

## 2023-12-25 DIAGNOSIS — F3341 Major depressive disorder, recurrent, in partial remission: Secondary | ICD-10-CM
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
## 2024-01-04 ENCOUNTER — Ambulatory Visit: Admitting: Family Medicine

## 2024-01-08 ENCOUNTER — Ambulatory Visit (INDEPENDENT_AMBULATORY_CARE_PROVIDER_SITE_OTHER): Admitting: Psychology

## 2024-01-08 DIAGNOSIS — F3341 Major depressive disorder, recurrent, in partial remission: Secondary | ICD-10-CM | POA: Diagnosis not present

## 2024-01-08 DIAGNOSIS — F411 Generalized anxiety disorder: Secondary | ICD-10-CM | POA: Diagnosis not present
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
## 2024-01-22 ENCOUNTER — Telehealth: Payer: Self-pay

## 2024-01-22 ENCOUNTER — Ambulatory Visit: Admitting: Psychology

## 2024-01-22 DIAGNOSIS — F411 Generalized anxiety disorder: Secondary | ICD-10-CM
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
## 2024-01-22 NOTE — Telephone Encounter (Signed)
 From my end it looks like the bone density scan from 2/3 has been signed.  I'm not sure how else to sign it.

## 2024-01-22 NOTE — Telephone Encounter (Signed)
 Copied from CRM [id number]. Topic: Clinical - Request for Lab/Test Order >> Jan 22, 2024 12:34 PM [employer] wrote: Reason for CRM: Patient stated she needs Dr. Arabella Beach to sign her orders for her bone density scan.  Callback #: [id number]

## 2024-01-23 ENCOUNTER — Telehealth: Payer: Self-pay | Admitting: *Deleted

## 2024-01-23 ENCOUNTER — Telehealth: Payer: Self-pay

## 2024-01-23 NOTE — Telephone Encounter (Signed)
 LVM for pt to inform her of the bone density appointment that was scheduled.    [facility] [address] [place], [place] [address] Phone-[phone] if you need to reschedule.   Stop any calcium  supplement 2 days prior to appointment.   NO metal in your waistband, suggest wearing elastic.   When you arrive enter on the left side of building and check in at the 3rd desk on the left.   Appointment is 01/30/24 @ 10:30am- please arrive at 10:15am to get checked in.  Go down [address] and take the [address] Exit, you will turn right and the entry is on the left just look for the sign.   If you have any questions please call me at [phone]

## 2024-01-23 NOTE — Telephone Encounter (Signed)
 Copied from CRM [id number]. Topic: Clinical - Request for Lab/Test Order >> Jan 23, 2024  9:15 AM Rosamond Comes wrote: Reason for CRM: patient returning call, missed call. Read note from [employer] RMA verbatim.  Patient would like to speak with Devann Cribb  Returned pt called LVM contact the office

## 2024-01-23 NOTE — Addendum Note (Signed)
 Addended by: ZIMMERMAN RUMPLE, Andriana Casa D on: 01/23/2024 09:24 AM   Modules accepted: Orders

## 2024-01-25 DIAGNOSIS — R29890 Loss of height: Secondary | ICD-10-CM | POA: Diagnosis not present

## 2024-01-25 DIAGNOSIS — Z1231 Encounter for screening mammogram for malignant neoplasm of breast: Secondary | ICD-10-CM | POA: Diagnosis not present

## 2024-01-25 DIAGNOSIS — M8588 Other specified disorders of bone density and structure, other site: Secondary | ICD-10-CM | POA: Diagnosis not present

## 2024-01-25 LAB — HM MAMMOGRAPHY

## 2024-01-25 LAB — HM DEXA SCAN

## 2024-01-25 NOTE — Telephone Encounter (Signed)
 Contacted pt to be sure she received her appt information and she did.

## 2024-01-26 ENCOUNTER — Encounter: Payer: Self-pay | Admitting: Family Medicine

## 2024-01-29 ENCOUNTER — Encounter: Payer: Self-pay | Admitting: Family Medicine

## 2024-01-30 ENCOUNTER — Other Ambulatory Visit (HOSPITAL_BASED_OUTPATIENT_CLINIC_OR_DEPARTMENT_OTHER): Admitting: Radiology

## 2024-01-30 ENCOUNTER — Other Ambulatory Visit: Payer: Self-pay | Admitting: Family Medicine

## 2024-01-30 DIAGNOSIS — F411 Generalized anxiety disorder: Secondary | ICD-10-CM

## 2024-01-30 MED ORDER — DULOXETINE HCL 20 MG PO CPEP: 20.0000 mg | ORAL_CAPSULE | Freq: Two times a day (BID) | ORAL | 2 refills | Status: DC

## 2024-01-30 NOTE — Telephone Encounter (Signed)
 Copied from CRM [id number]. Topic: Clinical - Medication Refill >> Jan 30, 2024 11:32 AM Silvana PARAS wrote: Medication: DULoxetine  (CYMBALTA ) 20 MG capsule  Has the patient contacted their pharmacy? Yes (Agent: If no, request that the patient contact the pharmacy for the refill. If patient does not wish to contact the pharmacy document the reason why and proceed with request.) (Agent: If yes, when and what did the pharmacy advise?)  This is the patient's preferred pharmacy:  [facility] - [place], [place] - [address] [place] [address] Phone: [phone] Fax: [phone]  Is this the correct pharmacy for this prescription? Yes If no, delete pharmacy and type the correct one.   Has the prescription been filled recently? Yes  Is the patient out of the medication? No  Has the patient been seen for an appointment in the last year OR does the patient have an upcoming appointment? Yes  Can we respond through MyChart? Yes  Agent: Please be advised that Rx refills may take up to 3 business days. We ask that you follow-up with your pharmacy.

## 2024-02-05 ENCOUNTER — Ambulatory Visit: Admitting: Psychology

## 2024-02-18 ENCOUNTER — Other Ambulatory Visit: Payer: Self-pay | Admitting: Family Medicine

## 2024-02-18 DIAGNOSIS — F411 Generalized anxiety disorder: Secondary | ICD-10-CM

## 2024-02-19 ENCOUNTER — Ambulatory Visit: Admitting: Psychology

## 2024-03-04 ENCOUNTER — Ambulatory Visit (INDEPENDENT_AMBULATORY_CARE_PROVIDER_SITE_OTHER): Admitting: Psychology

## 2024-03-04 DIAGNOSIS — F411 Generalized anxiety disorder: Secondary | ICD-10-CM

## 2024-03-04 DIAGNOSIS — F3341 Major depressive disorder, recurrent, in partial remission: Secondary | ICD-10-CM
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
## 2024-03-14 ENCOUNTER — Ambulatory Visit

## 2024-03-14 ENCOUNTER — Other Ambulatory Visit: Payer: Self-pay | Admitting: Family Medicine

## 2024-03-14 DIAGNOSIS — I10 Essential (primary) hypertension: Secondary | ICD-10-CM

## 2024-03-14 DIAGNOSIS — F411 Generalized anxiety disorder: Secondary | ICD-10-CM

## 2024-03-14 DIAGNOSIS — Z0000 Encounter for general adult medical examination without abnormal findings: Secondary | ICD-10-CM | POA: Diagnosis not present

## 2024-03-14 MED ORDER — CARVEDILOL 6.25 MG PO TABS: 6.2500 mg | ORAL_TABLET | Freq: Two times a day (BID) | ORAL | 3 refills | Status: AC

## 2024-03-14 MED ORDER — DULOXETINE HCL 20 MG PO CPEP: 20.0000 mg | ORAL_CAPSULE | Freq: Two times a day (BID) | ORAL | 3 refills | Status: DC

## 2024-03-14 NOTE — Progress Notes (Signed)
 Subjective:   Amanda [name] is a 73 y.o. who presents for a Medicare Wellness preventive visit.  As a reminder, Annual Wellness Visits don't include a physical exam, and some assessments may be limited, especially if this visit is performed virtually. We may recommend an in-person follow-up visit with your provider if needed.  Visit Complete: Virtual I connected with  Amanda [name] on 03/14/24 by a audio enabled telemedicine application and verified that I am speaking with the correct person using two identifiers.  Patient Location: Home  Provider Location: Home Office  I discussed the limitations of evaluation and management by telemedicine. The patient expressed understanding and agreed to proceed.  Vital Signs: Because this visit was a virtual/telehealth visit, some criteria may be missing or patient reported. Any vitals not documented were not able to be obtained and vitals that have been documented are patient reported.  VideoError- Librarian, academic were attempted between this provider and patient, however failed, due to patient having technical difficulties OR patient did not have access to video capability.  We continued and completed visit with audio only.   Persons Participating in Visit: Patient.  AWV Questionnaire: No: Patient Medicare AWV questionnaire was not completed prior to this visit.  Cardiac Risk Factors include: advanced age (>78men, >85 women);hypertension     Objective:    Today's Vitals   There is no height or weight on file to calculate BMI.     03/14/2024    2:31 PM 07/26/2018    2:33 PM  Advanced Directives  Does Patient Have a Medical Advance Directive? Yes No   Type of Scientist, research (life sciences) of Healthcare Power of Attorney in Chart? No - copy requested   Would patient like information on creating a medical advance directive?  No - Patient declined      Data saved with a  previous flowsheet row definition    Current Medications (verified) Outpatient Encounter Medications as of 03/14/2024  Medication Sig   carvedilol  (COREG ) 6.25 MG tablet Take 1 tablet (6.25 mg total) by mouth 2 (two) times daily with a meal.   DULoxetine  (CYMBALTA ) 20 MG capsule Take 1 capsule (20 mg total) by mouth 2 (two) times daily.   Garlic (GARLIQUE PO) Take 1 tablet by mouth daily. Garlique healthy cholesterol formula   levofloxacin  (LEVAQUIN ) 750 MG tablet Take 1 tablet (750 mg total) by mouth daily. (Patient not taking: Reported on 03/14/2024)   No facility-administered encounter medications on file as of 03/14/2024.    Allergies (verified) Atorvastatin  and Penicillins   History: Past Medical History:  Diagnosis Date   Hypertension    Jaw fracture (HCC)    Past Surgical History:  Procedure Laterality Date   BREAST SURGERY     crystals   CESAREAN SECTION     LEG SURGERY     SPLENECTOMY     Family History  Problem Relation Age of Onset   Heart attack Mother    Hyperlipidemia Mother    Hypertension Mother    Heart disease Mother    Breast cancer Neg Hx    Social History   Socioeconomic History   Marital status: Divorced    Spouse name: Not on file   Number of children: Not on file   Years of education: Not on file   Highest education level: Not on file  Occupational History   Not on file  Tobacco Use   Smoking status: Every Day  Current packs/day: 0.25    Average packs/day: 0.3 packs/day for 10.0 years (2.5 ttl pk-yrs)    Types: Cigarettes   Smokeless tobacco: Never  Vaping Use   Vaping status: Never Used  Substance and Sexual Activity   Alcohol use: Yes    Alcohol/week: 1.0 standard drink of alcohol    Types: 1 Glasses of wine per week   Drug use: Never   Sexual activity: Not Currently  Other Topics Concern   Not on file  Social History Narrative   Not on file   Social Drivers of Health   Financial Resource Strain: Low Risk  (03/14/2024)    Overall Financial Resource Strain (CARDIA)    Difficulty of Paying Living Expenses: Not hard at all  Food Insecurity: No Food Insecurity (03/14/2024)   Hunger Vital Sign    Worried About Running Out of Food in the Last Year: Never true    Ran Out of Food in the Last Year: Never true  Transportation Needs: No Transportation Needs (03/14/2024)   PRAPARE - Administrator, Civil Service (Medical): No    Lack of Transportation (Non-Medical): No  Physical Activity: Insufficiently Active (03/14/2024)   Exercise Vital Sign    Days of Exercise per Week: 4 days    Minutes of Exercise per Session: 30 min  Stress: Stress Concern Present (03/14/2024)   [employer] of Occupational Health - Occupational Stress Questionnaire    Feeling of Stress: To some extent  Social Connections: Socially Isolated (03/14/2024)   Social Connection and Isolation Panel    Frequency of Communication with Friends and Family: Once a week    Frequency of Social Gatherings with Friends and Family: More than three times a week    Attends Religious Services: Never    Database administrator or Organizations: No    Attends Engineer, structural: Never    Marital Status: Divorced    Tobacco Counseling Ready to quit: Not Answered Counseling given: Not Answered    Clinical Intake:  Pre-visit preparation completed: Yes  Pain : No/denies pain     Nutritional Risks: None Diabetes: No  Lab Results  Component Value Date   HGBA1C 6.0 (H) 03/09/2023   HGBA1C 5.9 (H) 06/21/2022   HGBA1C 5.9 (H) 07/06/2021     How often do you need to have someone help you when you read instructions, pamphlets, or other written materials from your doctor or pharmacy?: 1 - Never  Interpreter Needed?: No  Information entered by :: NAllen LPN   Activities of Daily Living     03/14/2024    2:17 PM  In your present state of health, do you have any difficulty performing the following activities:  Hearing? 0  Vision?  0  Difficulty concentrating or making decisions? 0  Walking or climbing stairs? 0  Dressing or bathing? 0  Doing errands, shopping? 0  Preparing Food and eating ? N  Using the Toilet? N  In the past six months, have you accidently leaked urine? N  Do you have problems with loss of bowel control? N  Managing your Medications? N  Managing your Finances? N  Housekeeping or managing your Housekeeping? N    Patient Care Team: Chandra Toribio POUR, MD as PCP - General (Family Medicine)  I have updated your Care Teams any recent Medical Services you may have received from other providers in the past year.     Assessment:   This is a routine wellness examination for Amanda [name].  Hearing/Vision screen Hearing Screening - Comments:: Denies hearing issues Vision Screening - Comments:: No regular eye exams,   Goals Addressed             This Visit's Progress    Patient Stated       03/14/2024, denies goals       Depression Screen     03/14/2024    2:36 PM 11/14/2023   10:18 AM 10/10/2023    8:56 AM 09/11/2023    9:32 AM 07/20/2023    8:41 AM 05/23/2023   10:43 AM 03/09/2023    9:47 AM  PHQ 2/9 Scores  PHQ - 2 Score 3 1 1  0 0 0 3  PHQ- 9 Score 5 2 4 1  0 0 8    Fall Risk     03/14/2024    2:35 PM 05/23/2023   10:43 AM 06/23/2022    9:30 AM 12/20/2021    9:56 AM 06/22/2021   10:40 AM  Fall Risk   Falls in the past year? 1 0 0 0 0  Number falls in past yr: 0 0 0 0 0  Injury with Fall? 0 0 0 0 0  Comment sore knee      Risk for fall due to : Medication side effect   No Fall Risks No Fall Risks  Follow up Falls prevention discussed;Falls evaluation completed Falls evaluation completed;Falls prevention discussed  Falls evaluation completed  Falls evaluation completed      Data saved with a previous flowsheet row definition    MEDICARE RISK AT HOME:  Medicare Risk at Home Any stairs in or around the home?: Yes If so, are there any without handrails?: No Home free of loose throw rugs  in walkways, pet beds, electrical cords, etc?: Yes Adequate lighting in your home to reduce risk of falls?: Yes Life alert?: No Use of a cane, walker or w/c?: No Grab bars in the bathroom?: Yes Shower chair or bench in shower?: No Elevated toilet seat or a handicapped toilet?: No  TIMED UP AND GO:  Was the test performed?  No  Cognitive Function: 6CIT completed        03/14/2024    2:41 PM 06/23/2022    9:24 AM 06/22/2021   10:14 AM 06/11/2020   10:49 AM  6CIT Screen  What Year? 0 points 0 points 0 points 0 points  What month? 0 points 0 points 0 points 0 points  What time? 0 points 0 points 0 points 0 points  Count back from 20 0 points 0 points 0 points 0 points  Months in reverse 0 points 0 points 0 points 0 points  Repeat phrase 0 points 2 points 8 points 2 points  Total Score 0 points 2 points 8 points 2 points    Immunizations Immunization History  Administered Date(s) Administered   PFIZER(Purple Top)SARS-COV-2 Vaccination 10/06/2019, 10/30/2019   Tdap 08/09/2011    Screening Tests Health Maintenance  Topic Date Due   Hepatitis C Screening  Never done   Pneumococcal Vaccine: 50+ Years (1 of 2 - PCV) Never done   Zoster Vaccines- Shingrix (1 of 2) Never done   COVID-19 Vaccine (3 - Pfizer risk series) 11/27/2019   DTaP/Tdap/Td (2 - Td or Tdap) 08/08/2021   INFLUENZA VACCINE  03/08/2024   Medicare Annual Wellness (AWV)  03/14/2025   Fecal DNA (Cologuard)  12/18/2025   DEXA SCAN  Completed   Hepatitis B Vaccines  Aged Out   HPV VACCINES  Aged Out  Meningococcal B Vaccine  Aged Out    Health Maintenance  Health Maintenance Due  Topic Date Due   Hepatitis C Screening  Never done   Pneumococcal Vaccine: 50+ Years (1 of 2 - PCV) Never done   Zoster Vaccines- Shingrix (1 of 2) Never done   COVID-19 Vaccine (3 - Pfizer risk series) 11/27/2019   DTaP/Tdap/Td (2 - Td or Tdap) 08/08/2021   INFLUENZA VACCINE  03/08/2024   Health Maintenance Items  Addressed: Declines vaccines. Due for hep c screening  Additional Screening:  Vision Screening: Recommended annual ophthalmology exams for early detection of glaucoma and other disorders of the eye. Would you like a referral to an eye doctor? No    Dental Screening: Recommended annual dental exams for proper oral hygiene  Community Resource Referral / Chronic Care Management: CRR required this visit?  No   CCM required this visit?  No   Plan:    I have personally reviewed and noted the following in the patient's chart:   Medical and social history Use of alcohol, tobacco or illicit drugs  Current medications and supplements including opioid prescriptions. Patient is not currently taking opioid prescriptions. Functional ability and status Nutritional status Physical activity Advanced directives List of other physicians Hospitalizations, surgeries, and ER visits in previous 12 months Vitals Screenings to include cognitive, depression, and falls Referrals and appointments  In addition, I have reviewed and discussed with patient certain preventive protocols, quality metrics, and best practice recommendations. A written personalized care plan for preventive services as well as general preventive health recommendations were provided to patient.   Amanda FORBES Dawn, LPN   08/09/7972   After Visit Summary: (Pick Up) Due to this being a telephonic visit, with patients personalized plan was offered to patient and patient has requested to Pick up at office.  Notes: Nothing significant to report at this time.

## 2024-03-14 NOTE — Patient Instructions (Signed)
 Amanda [name] , Thank you for taking time out of your busy schedule to complete your Annual Wellness Visit with me. I enjoyed our conversation and look forward to speaking with you again next year. I, as well as your care team,  appreciate your ongoing commitment to your health goals. Please review the following plan we discussed and let me know if I can assist you in the future. Your Game plan/ To Do List    Referrals: If you haven't heard from the office you've been referred to, please reach out to them at the phone provided.   Follow up Visits: We will see or speak with you next year for your Next Medicare AWV with our clinical staff Have you seen your provider in the last 6 months (3 months if uncontrolled diabetes)? Yes  Clinician Recommendations:  Aim for 30 minutes of exercise or brisk walking, 6-8 glasses of water, and 5 servings of fruits and vegetables each day.       This is a list of the screenings recommended for you:  Health Maintenance  Topic Date Due   Hepatitis C Screening  Never done   Pneumococcal Vaccine for age over 35 (1 of 2 - PCV) Never done   Zoster (Shingles) Vaccine (1 of 2) Never done   COVID-19 Vaccine (3 - Pfizer risk series) 11/27/2019   DTaP/Tdap/Td vaccine (2 - Td or Tdap) 08/08/2021   Flu Shot  03/08/2024   Medicare Annual Wellness Visit  03/14/2025   Cologuard (Stool DNA test)  12/18/2025   DEXA scan (bone density measurement)  Completed   Hepatitis B Vaccine  Aged Out   HPV Vaccine  Aged Out   Meningitis B Vaccine  Aged Out    Advanced directives: (Copy Requested) Please bring a copy of your health care power of attorney and living will to the office to be added to your chart at your convenience. You can mail to [facility] [address] [place], [place] [address] or email to [email or web address] Advance Care Planning is important because it:  [x]  Makes sure you receive the medical care that is consistent with your values,  goals, and preferences  [x]  It provides guidance to your family and loved ones and reduces their decisional burden about whether or not they are making the right decisions based on your wishes.  Follow the link provided in your after visit summary or read over the paperwork we have mailed to you to help you started getting your Advance Directives in place. If you need assistance in completing these, please reach out to us  so that we can help you!  See attachments for Preventive Care and Fall Prevention Tips.

## 2024-03-18 ENCOUNTER — Ambulatory Visit (INDEPENDENT_AMBULATORY_CARE_PROVIDER_SITE_OTHER): Admitting: Psychology

## 2024-03-18 DIAGNOSIS — F3341 Major depressive disorder, recurrent, in partial remission: Secondary | ICD-10-CM

## 2024-03-18 DIAGNOSIS — F411 Generalized anxiety disorder: Secondary | ICD-10-CM | POA: Diagnosis not present
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
## 2024-03-25 ENCOUNTER — Ambulatory Visit: Admitting: Psychology

## 2024-03-25 NOTE — Progress Notes (Unsigned)
   Hilma Favors, PhD

## 2024-04-01 ENCOUNTER — Ambulatory Visit: Admitting: Psychology

## 2024-04-01 DIAGNOSIS — F3341 Major depressive disorder, recurrent, in partial remission: Secondary | ICD-10-CM | POA: Diagnosis not present

## 2024-04-01 DIAGNOSIS — F411 Generalized anxiety disorder: Secondary | ICD-10-CM | POA: Diagnosis not present
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
## 2024-04-15 ENCOUNTER — Ambulatory Visit (INDEPENDENT_AMBULATORY_CARE_PROVIDER_SITE_OTHER): Admitting: Psychology

## 2024-04-15 DIAGNOSIS — F3341 Major depressive disorder, recurrent, in partial remission: Secondary | ICD-10-CM

## 2024-04-15 DIAGNOSIS — F411 Generalized anxiety disorder: Secondary | ICD-10-CM
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
## 2024-04-29 ENCOUNTER — Ambulatory Visit (INDEPENDENT_AMBULATORY_CARE_PROVIDER_SITE_OTHER): Admitting: Psychology

## 2024-04-29 DIAGNOSIS — F3341 Major depressive disorder, recurrent, in partial remission: Secondary | ICD-10-CM | POA: Diagnosis not present

## 2024-04-29 DIAGNOSIS — F411 Generalized anxiety disorder: Secondary | ICD-10-CM
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
## 2024-05-06 ENCOUNTER — Other Ambulatory Visit: Payer: 59

## 2024-05-06 ENCOUNTER — Telehealth: Payer: Self-pay | Admitting: *Deleted

## 2024-05-06 NOTE — Telephone Encounter (Signed)
 Copied from CRM #[id number]. Topic: Clinical - Request for Lab/Test Order >> May 06, 2024 11:08 AM Amy B wrote: Reason for CRM: Patient states she had an appointment scheduled for today for her DEXA scan; however, the location cancelled the appointment.  The patient states she was told they do not  DEXA scans at that location.  Patient would like to know where she can go to have this study done.

## 2024-05-08 NOTE — Telephone Encounter (Signed)
 Patient is calling in returning a call from the office. Patient says she will speak with the office regarding the DEXA scan when she comes in on 05/15/24.

## 2024-05-08 NOTE — Telephone Encounter (Signed)
 LVM for pt to call office to discuss locations to see about where she would like for us  to send the order.

## 2024-05-13 ENCOUNTER — Ambulatory Visit (INDEPENDENT_AMBULATORY_CARE_PROVIDER_SITE_OTHER): Admitting: Psychology

## 2024-05-13 DIAGNOSIS — F411 Generalized anxiety disorder: Secondary | ICD-10-CM

## 2024-05-13 DIAGNOSIS — F3341 Major depressive disorder, recurrent, in partial remission: Secondary | ICD-10-CM | POA: Diagnosis not present
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
## 2024-05-15 ENCOUNTER — Ambulatory Visit: Admitting: Family Medicine

## 2024-05-21 ENCOUNTER — Ambulatory Visit (INDEPENDENT_AMBULATORY_CARE_PROVIDER_SITE_OTHER): Admitting: Family Medicine

## 2024-05-21 ENCOUNTER — Encounter: Payer: Self-pay | Admitting: Family Medicine

## 2024-05-21 VITALS — BP 143/91 | HR 67 | Ht 66.0 in | Wt 154.1 lb

## 2024-05-21 DIAGNOSIS — Z0000 Encounter for general adult medical examination without abnormal findings: Secondary | ICD-10-CM

## 2024-05-21 DIAGNOSIS — E7800 Pure hypercholesterolemia, unspecified: Secondary | ICD-10-CM

## 2024-05-21 DIAGNOSIS — R7303 Prediabetes: Secondary | ICD-10-CM | POA: Diagnosis not present

## 2024-05-21 DIAGNOSIS — L821 Other seborrheic keratosis: Secondary | ICD-10-CM | POA: Diagnosis not present

## 2024-05-21 DIAGNOSIS — F411 Generalized anxiety disorder: Secondary | ICD-10-CM

## 2024-05-21 DIAGNOSIS — G4486 Cervicogenic headache: Secondary | ICD-10-CM | POA: Diagnosis not present

## 2024-05-21 DIAGNOSIS — R519 Headache, unspecified: Secondary | ICD-10-CM | POA: Insufficient documentation

## 2024-05-21 DIAGNOSIS — I10 Essential (primary) hypertension: Secondary | ICD-10-CM

## 2024-05-21 NOTE — Assessment & Plan Note (Signed)
 Reports headache for 2 days, radiating from posterior neck forward. Pain reproduced with neck movement. History of similar headaches. No red flag symptoms like visual changes. - Continue PRN Aleve. - Discussed non-pharmacologic treatments including neck stretches, yoga, and home traction devices (foam roll, inflatable cushion, inversion table, pulley systems).

## 2024-05-21 NOTE — Assessment & Plan Note (Signed)
 Lesion on back previously treated with cryotherapy is smaller but still present. - Cryotherapy applied to the lesion on the back today. - Advised not to treat the lesion on the arm with cryotherapy at this time due to surrounding bruising. Will reassess once bruising resolves.

## 2024-05-21 NOTE — Patient Instructions (Addendum)
 It was nice to see you today,  We addressed the following topics today: - Check blood pressure at home for 2 weeks and return the log to us  when you are done - You can get your blood work done today, even though you have eaten. Fasting is not strictly necessary for these tests. - three are multiple types of neck traction devices that can be purchased for home use to help with cervicogenic headaches.  Have a great day,  Rolan Slain, MD
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
## 2024-05-21 NOTE — Assessment & Plan Note (Signed)
 Ongoing anxiety, seems situational related to finances and work. Reports craft-making is a positive coping mechanism. Continues duloxetine . - Continue current medications.

## 2024-05-21 NOTE — Assessment & Plan Note (Signed)
 BP elevated in office. - BP to be rechecked before leaving. - If BP remains elevated on second check, will provide instructions for home BP monitoring for 2 weeks with a return of the log.

## 2024-05-22 ENCOUNTER — Ambulatory Visit: Payer: Self-pay | Admitting: Family Medicine

## 2024-05-22 LAB — CBC WITH DIFFERENTIAL/PLATELET
Basophils Absolute: 0.1 x10E3/uL (ref 0.0–0.2)
Basos: 1 %
EOS (ABSOLUTE): 0.2 x10E3/uL (ref 0.0–0.4)
Eos: 2 %
Hematocrit: 40.9 % (ref 34.0–46.6)
Hemoglobin: 13.3 g/dL (ref 11.1–15.9)
Immature Grans (Abs): 0 x10E3/uL (ref 0.0–0.1)
Immature Granulocytes: 0 %
Lymphocytes Absolute: 2.2 x10E3/uL (ref 0.7–3.1)
Lymphs: 19 %
MCH: 33 pg (ref 26.6–33.0)
MCHC: 32.5 g/dL (ref 31.5–35.7)
MCV: 102 fL — ABNORMAL HIGH (ref 79–97)
Monocytes Absolute: 1.3 x10E3/uL — ABNORMAL HIGH (ref 0.1–0.9)
Monocytes: 11 %
Neutrophils Absolute: 7.9 x10E3/uL — ABNORMAL HIGH (ref 1.4–7.0)
Neutrophils: 67 %
Platelets: 388 x10E3/uL (ref 150–450)
RBC: 4.03 x10E6/uL (ref 3.77–5.28)
RDW: 12.1 % (ref 11.7–15.4)
WBC: 11.8 x10E3/uL — ABNORMAL HIGH (ref 3.4–10.8)

## 2024-05-22 LAB — LIPID PANEL
Chol/HDL Ratio: 3.5 ratio (ref 0.0–4.4)
Cholesterol, Total: 199 mg/dL (ref 100–199)
HDL: 57 mg/dL (ref >39)
LDL Chol Calc (NIH): 107 mg/dL — ABNORMAL HIGH (ref 0–99)
Triglycerides: 206 mg/dL — ABNORMAL HIGH (ref 0–149)
VLDL Cholesterol Cal: 35 mg/dL (ref 5–40)

## 2024-05-22 LAB — COMPREHENSIVE METABOLIC PANEL WITH GFR
ALT: 11 IU/L (ref 0–32)
AST: 12 IU/L (ref 0–40)
Albumin: 3.7 g/dL — ABNORMAL LOW (ref 3.8–4.8)
Alkaline Phosphatase: 84 IU/L (ref 49–135)
BUN/Creatinine Ratio: 35 — ABNORMAL HIGH (ref 12–28)
BUN: 18 mg/dL (ref 8–27)
Bilirubin Total: 0.2 mg/dL (ref 0.0–1.2)
CO2: 23 mmol/L (ref 20–29)
Calcium: 9.8 mg/dL (ref 8.7–10.3)
Chloride: 106 mmol/L (ref 96–106)
Creatinine, Ser: 0.52 mg/dL — ABNORMAL LOW (ref 0.57–1.00)
Globulin, Total: 2.2 g/dL (ref 1.5–4.5)
Glucose: 78 mg/dL (ref 70–99)
Potassium: 4.1 mmol/L (ref 3.5–5.2)
Sodium: 142 mmol/L (ref 134–144)
Total Protein: 5.9 g/dL — ABNORMAL LOW (ref 6.0–8.5)
eGFR: 97 mL/min/1.73 (ref >59)

## 2024-05-22 LAB — HEMOGLOBIN A1C
Est. average glucose Bld gHb Est-mCnc: 120 mg/dL
Hgb A1c MFr Bld: 5.8 % — ABNORMAL HIGH (ref 4.8–5.6)

## 2024-05-22 LAB — TSH: TSH: 1.56 u[IU]/mL (ref 0.450–4.500)

## 2024-05-27 ENCOUNTER — Ambulatory Visit (INDEPENDENT_AMBULATORY_CARE_PROVIDER_SITE_OTHER): Admitting: Psychology

## 2024-05-27 DIAGNOSIS — F3341 Major depressive disorder, recurrent, in partial remission: Secondary | ICD-10-CM

## 2024-05-27 DIAGNOSIS — F411 Generalized anxiety disorder: Secondary | ICD-10-CM
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
## 2024-05-29 ENCOUNTER — Other Ambulatory Visit: Payer: Self-pay | Admitting: Family Medicine

## 2024-05-29 DIAGNOSIS — F411 Generalized anxiety disorder: Secondary | ICD-10-CM

## 2024-05-29 NOTE — Telephone Encounter (Unsigned)
 Copied from CRM [id number]. Topic: Clinical - Medication Refill >> May 29, 2024  1:44 PM Tiffini S wrote: Medication: DULoxetine  (CYMBALTA ) 20 MG capsule  Has the patient contacted their pharmacy? No (Agent: If no, request that the patient contact the pharmacy for the refill. If patient does not wish to contact the pharmacy document the reason why and proceed with request.) (Agent: If yes, when and what did the pharmacy advise?)  This is the patient's preferred pharmacy:  [facility] - [place], [place] - [address] [place] [address] Phone: [phone] Fax: [phone]  Is this the correct pharmacy for this prescription? Yes If no, delete pharmacy and type the correct one.   Has the prescription been filled recently? Yes  Is the patient out of the medication? Yes, 4 tablets left   Has the patient been seen for an appointment in the last year OR does the patient have an upcoming appointment? Yes  Can we respond through MyChart? Yes  Agent: Please be advised that Rx refills may take up to 3 business days. We ask that you follow-up with your pharmacy.

## 2024-05-29 NOTE — Telephone Encounter (Signed)
 Copied from CRM [id number]. Topic: Clinical - Medication Refill >> May 29, 2024  1:44 PM Tiffini S wrote: Medication: DULoxetine  (CYMBALTA ) 20 MG capsule  Has the patient contacted their pharmacy? No (Agent: If no, request that the patient contact the pharmacy for the refill. If patient does not wish to contact the pharmacy document the reason why and proceed with request.) (Agent: If yes, when and what did the pharmacy advise?)  This is the patient's preferred pharmacy:  [facility] - [place], [place] - [address] [place] [address] Phone: [phone] Fax: [phone]  Is this the correct pharmacy for this prescription? Yes If no, delete pharmacy and type the correct one.   Has the prescription been filled recently? Yes  Is the patient out of the medication? Yes, 4 tablets left   Has the patient been seen for an appointment in the last year OR does the patient have an upcoming appointment? Yes  Can we respond through MyChart? Yes  Agent: Please be advised that Rx refills may take up to 3 business days. We ask that you follow-up with your pharmacy.

## 2024-05-30 MED ORDER — DULOXETINE HCL 20 MG PO CPEP: 20.0000 mg | ORAL_CAPSULE | Freq: Two times a day (BID) | ORAL | 3 refills | Status: AC

## 2024-06-10 ENCOUNTER — Ambulatory Visit: Admitting: Psychology

## 2024-06-10 DIAGNOSIS — F411 Generalized anxiety disorder: Secondary | ICD-10-CM

## 2024-06-10 DIAGNOSIS — F3341 Major depressive disorder, recurrent, in partial remission: Secondary | ICD-10-CM
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
## 2024-07-08 ENCOUNTER — Ambulatory Visit: Admitting: Psychology

## 2024-07-08 DIAGNOSIS — F3341 Major depressive disorder, recurrent, in partial remission: Secondary | ICD-10-CM | POA: Diagnosis not present

## 2024-07-08 DIAGNOSIS — F411 Generalized anxiety disorder: Secondary | ICD-10-CM
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
## 2024-07-22 ENCOUNTER — Ambulatory Visit: Admitting: Psychology

## 2024-07-22 DIAGNOSIS — F411 Generalized anxiety disorder: Secondary | ICD-10-CM | POA: Diagnosis not present

## 2024-07-22 DIAGNOSIS — F330 Major depressive disorder, recurrent, mild: Secondary | ICD-10-CM | POA: Diagnosis not present
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
## 2024-08-05 ENCOUNTER — Ambulatory Visit: Admitting: Psychology

## 2024-08-05 DIAGNOSIS — F330 Major depressive disorder, recurrent, mild: Secondary | ICD-10-CM | POA: Diagnosis not present
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
## 2024-08-19 ENCOUNTER — Ambulatory Visit: Admitting: Psychology

## 2024-08-19 DIAGNOSIS — F411 Generalized anxiety disorder: Secondary | ICD-10-CM

## 2024-08-19 DIAGNOSIS — F330 Major depressive disorder, recurrent, mild: Secondary | ICD-10-CM
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
## 2024-09-02 ENCOUNTER — Ambulatory Visit: Admitting: Psychology

## 2024-09-02 DIAGNOSIS — F411 Generalized anxiety disorder: Secondary | ICD-10-CM | POA: Diagnosis not present

## 2024-09-02 DIAGNOSIS — F3341 Major depressive disorder, recurrent, in partial remission: Secondary | ICD-10-CM | POA: Diagnosis not present

## 2024-09-02 DIAGNOSIS — F330 Major depressive disorder, recurrent, mild: Secondary | ICD-10-CM
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
## 2024-09-16 ENCOUNTER — Ambulatory Visit: Admitting: Psychology

## 2024-09-30 ENCOUNTER — Ambulatory Visit: Admitting: Psychology

## 2024-10-14 ENCOUNTER — Ambulatory Visit: Admitting: Psychology

## 2024-10-28 ENCOUNTER — Ambulatory Visit: Admitting: Psychology

## 2024-11-11 ENCOUNTER — Ambulatory Visit: Admitting: Psychology

## 2024-11-19 ENCOUNTER — Ambulatory Visit: Admitting: Family Medicine

## 2024-11-20 ENCOUNTER — Ambulatory Visit: Admitting: Family Medicine

## 2024-11-25 ENCOUNTER — Ambulatory Visit: Admitting: Psychology

## 2024-12-09 ENCOUNTER — Ambulatory Visit: Admitting: Psychology

## 2024-12-23 ENCOUNTER — Ambulatory Visit: Admitting: Psychology

## 2025-01-06 ENCOUNTER — Ambulatory Visit: Admitting: Psychology

## 2025-01-20 ENCOUNTER — Ambulatory Visit: Admitting: Psychology

## 2025-02-03 ENCOUNTER — Ambulatory Visit: Admitting: Psychology

## 2025-02-17 ENCOUNTER — Ambulatory Visit: Admitting: Psychology

## 2025-03-03 ENCOUNTER — Ambulatory Visit: Admitting: Psychology

## 2025-04-24 ENCOUNTER — Ambulatory Visit
